# Patient Record
Sex: Male | Born: 1965 | Race: White | Hispanic: No | Marital: Married | Smoking: Current every day smoker
Health system: Southern US, Community
[De-identification: ages and names within clinical notes are randomized; demographics above are authoritative.]

## PROBLEM LIST (undated history)

## (undated) DIAGNOSIS — M1990 Unspecified osteoarthritis, unspecified site: Secondary | ICD-10-CM

## (undated) DIAGNOSIS — F1010 Alcohol abuse, uncomplicated: Secondary | ICD-10-CM

## (undated) DIAGNOSIS — F17200 Nicotine dependence, unspecified, uncomplicated: Secondary | ICD-10-CM

## (undated) DIAGNOSIS — K219 Gastro-esophageal reflux disease without esophagitis: Secondary | ICD-10-CM

## (undated) HISTORY — DX: Gastro-esophageal reflux disease without esophagitis: K21.9

## (undated) HISTORY — DX: Nicotine dependence, unspecified, uncomplicated: F17.200

## (undated) HISTORY — DX: Alcohol abuse, uncomplicated: F10.10

## (undated) HISTORY — DX: Unspecified osteoarthritis, unspecified site: M19.90

## (undated) HISTORY — PX: NO PAST SURGERIES: SHX2092

---

## 2010-07-12 ENCOUNTER — Inpatient Hospital Stay (HOSPITAL_COMMUNITY)
Admission: EM | Admit: 2010-07-12 | Discharge: 2010-07-15 | Payer: Self-pay | Source: Home / Self Care | Attending: Internal Medicine | Admitting: Internal Medicine

## 2010-10-09 LAB — BASIC METABOLIC PANEL
Calcium: 8.4 mg/dL (ref 8.4–10.5)
Creatinine, Ser: 0.86 mg/dL (ref 0.4–1.5)
GFR calc non Af Amer: >60 mL/min (ref >60)
Sodium: 136 mEq/L (ref 135–145)

## 2010-10-09 LAB — CBC
HCT: 35.7 % — ABNORMAL LOW (ref 39.0–52.0)
Hemoglobin: 12.2 g/dL — ABNORMAL LOW (ref 13.0–17.0)
MCHC: 34.2 g/dL (ref 30.0–36.0)
WBC: 7.6 10*3/uL (ref 4.0–10.5)

## 2010-10-09 LAB — WOUND CULTURE

## 2010-10-10 LAB — CBC
HCT: 40.2 % (ref 39.0–52.0)
Hemoglobin: 13.8 g/dL (ref 13.0–17.0)
MCH: 30.5 pg (ref 26.0–34.0)
RBC: 4.53 MIL/uL (ref 4.22–5.81)
RDW: 12 % (ref 11.5–15.5)
WBC: 13.6 10*3/uL — ABNORMAL HIGH (ref 4.0–10.5)

## 2010-10-10 LAB — BASIC METABOLIC PANEL
BUN: 6 mg/dL (ref 6–23)
Creatinine, Ser: 1.11 mg/dL (ref 0.4–1.5)
Glucose, Bld: 106 mg/dL — ABNORMAL HIGH (ref 70–99)
Potassium: 4.2 mEq/L (ref 3.5–5.1)

## 2010-10-10 LAB — CULTURE, BLOOD (ROUTINE X 2)
Culture  Setup Time: 201112150214
Culture  Setup Time: 201112150214
Culture: NO GROWTH

## 2010-10-10 LAB — DIFFERENTIAL
Basophils Absolute: 0 10*3/uL (ref 0.0–0.1)
Eosinophils Absolute: 0 10*3/uL (ref 0.0–0.7)
Eosinophils Relative: 0 % (ref 0–5)
Monocytes Relative: 8 % (ref 3–12)

## 2014-04-28 ENCOUNTER — Emergency Department (HOSPITAL_COMMUNITY): Payer: BC Managed Care – PPO

## 2014-04-28 ENCOUNTER — Emergency Department (HOSPITAL_COMMUNITY)
Admission: EM | Admit: 2014-04-28 | Discharge: 2014-04-28 | Disposition: A | Discharge status: Home / Self Care | Payer: BC Managed Care – PPO | Attending: Emergency Medicine | Admitting: Emergency Medicine

## 2014-04-28 ENCOUNTER — Encounter (HOSPITAL_COMMUNITY): Payer: Self-pay | Admitting: Emergency Medicine

## 2014-04-28 DIAGNOSIS — S0083XA Contusion of other part of head, initial encounter: Secondary | ICD-10-CM | POA: Insufficient documentation

## 2014-04-28 DIAGNOSIS — Y9301 Activity, walking, marching and hiking: Secondary | ICD-10-CM | POA: Diagnosis not present

## 2014-04-28 DIAGNOSIS — S0003XA Contusion of scalp, initial encounter: Secondary | ICD-10-CM | POA: Diagnosis not present

## 2014-04-28 DIAGNOSIS — S59919A Unspecified injury of unspecified forearm, initial encounter: Secondary | ICD-10-CM

## 2014-04-28 DIAGNOSIS — W19XXXA Unspecified fall, initial encounter: Secondary | ICD-10-CM

## 2014-04-28 DIAGNOSIS — W010XXA Fall on same level from slipping, tripping and stumbling without subsequent striking against object, initial encounter: Secondary | ICD-10-CM | POA: Insufficient documentation

## 2014-04-28 DIAGNOSIS — Y9289 Other specified places as the place of occurrence of the external cause: Secondary | ICD-10-CM | POA: Insufficient documentation

## 2014-04-28 DIAGNOSIS — S8990XA Unspecified injury of unspecified lower leg, initial encounter: Secondary | ICD-10-CM | POA: Insufficient documentation

## 2014-04-28 DIAGNOSIS — S9031XA Contusion of right foot, initial encounter: Secondary | ICD-10-CM

## 2014-04-28 DIAGNOSIS — S99919A Unspecified injury of unspecified ankle, initial encounter: Secondary | ICD-10-CM

## 2014-04-28 DIAGNOSIS — F17200 Nicotine dependence, unspecified, uncomplicated: Secondary | ICD-10-CM | POA: Insufficient documentation

## 2014-04-28 DIAGNOSIS — S9030XA Contusion of unspecified foot, initial encounter: Secondary | ICD-10-CM | POA: Insufficient documentation

## 2014-04-28 DIAGNOSIS — S1093XA Contusion of unspecified part of neck, initial encounter: Secondary | ICD-10-CM

## 2014-04-28 DIAGNOSIS — S99929A Unspecified injury of unspecified foot, initial encounter: Secondary | ICD-10-CM

## 2014-04-28 DIAGNOSIS — S6990XA Unspecified injury of unspecified wrist, hand and finger(s), initial encounter: Secondary | ICD-10-CM

## 2014-04-28 DIAGNOSIS — S59909A Unspecified injury of unspecified elbow, initial encounter: Secondary | ICD-10-CM | POA: Diagnosis not present

## 2014-04-28 DIAGNOSIS — M25522 Pain in left elbow: Secondary | ICD-10-CM

## 2014-04-28 MED ORDER — HYDROCODONE-ACETAMINOPHEN 5-325 MG PO TABS: 1.0000 | ORAL_TABLET | ORAL | Status: DC | PRN

## 2014-04-28 MED ORDER — HYDROCODONE-ACETAMINOPHEN 5-325 MG PO TABS
1.0000 | ORAL_TABLET | Freq: Once | ORAL | Status: AC
  Administered 2014-04-28: 1 via ORAL
  Filled 2014-04-28: qty 1

## 2014-04-28 MED ORDER — IBUPROFEN 800 MG PO TABS: 800.0000 mg | ORAL_TABLET | Freq: Three times a day (TID) | ORAL | Status: DC | PRN

## 2014-04-28 NOTE — ED Provider Notes (Signed)
CSN: [id number]     Arrival date & time 04/28/14  0602 History   First MD Initiated Contact with Patient 04/28/14 [phone]     Chief Complaint  Patient presents with  . Foot Injury     (Consider location/radiation/quality/duration/timing/severity/associated sxs/prior Treatment) HPI  Patient reports walking outside last night with his 5 dogs when his dogs "got under (his) feet" and tripped him.  He fell forward onto the ground, hitting his left elbow and right forehead.  Reports pain in his right dorsal foot and left elbow.  Denies LOC, headache, lightheadedness/dizziness, neck or back pain, CP, SOB, weakness or numbness of the extremities.  Reports tetanus vx is up to date.   History reviewed. No pertinent past medical history. History reviewed. No pertinent past surgical history. History reviewed. No pertinent family history. History  Substance Use Topics  . Smoking status: Current Every Day Smoker -- 1.00 packs/day    Types: Cigarettes  . Smokeless tobacco: Not on file  . Alcohol Use: Yes    Review of Systems  All other systems reviewed and are negative.     Allergies  Review of patient's allergies indicates no known allergies.  Home Medications   Prior to Admission medications   Medication Sig Start Date End Date Taking? Authorizing Provider  ibuprofen (ADVIL,MOTRIN) 800 MG tablet Take 800 mg by mouth every 8 (eight) hours as needed for mild pain.   Yes Historical Provider, MD   BP 142/103  Pulse 105  Temp(Src) 97.5 F (36.4 C) (Oral)  Resp 24  Ht 6' (1.829 m)  Wt 170 lb (77.111 kg)  BMI 23.05 kg/m2  SpO2 99% Physical Exam  Nursing note and vitals reviewed. Constitutional: He appears well-developed and well-nourished. No distress.  HENT:  Head: Normocephalic.    Neck: Neck supple.  Pulmonary/Chest: Effort normal.  Musculoskeletal:       Feet:  Right lower extremity:  Strength 5/5, sensation intact, distal pulses intact.   Calf nontender.  Compartments soft.   Tenderness over dorsal foot.  No crepitus.  Full AROM of all toes, slight decrease in ankle secondary to pain.    Left elbow with bony tenderness, mild edema over olecranon.  No break in skin.  Distal pulses and sensation intact, grip strength 5/5.   Neurological: He is alert. He has normal strength. No cranial nerve deficit or sensory deficit. He exhibits normal muscle tone. GCS eye subscore is 4. GCS verbal subscore is 5. GCS motor subscore is 6.  Skin: He is not diaphoretic.    ED Course  Procedures (including critical care time) Labs Review Labs Reviewed - No data to display  Imaging Review Dg Elbow Complete Left  04/28/2014   CLINICAL DATA:  Left elbow pain after fall.  EXAM: LEFT ELBOW - COMPLETE 3+ VIEW  COMPARISON:  None.  FINDINGS: There is no evidence of fracture, dislocation, or joint effusion. There is no evidence of arthropathy or other focal bone abnormality. Soft tissues are unremarkable.  IMPRESSION: Normal left elbow.   Electronically Signed   By: Roque LiasJames  Green M.D.   On: 04/28/2014 07:11   Dg Ankle Complete Right  04/28/2014   CLINICAL DATA:  Fall.  Foot pain.  EXAM: RIGHT ANKLE - COMPLETE 3+ VIEW  COMPARISON:  None.  FINDINGS: No evidence for an acute fracture. No subluxation or dislocation. Ankle mortise is preserved. Deformity of the base of the fifth metatarsal likely related to remote trauma.  IMPRESSION: No acute bony findings.   Electronically Signed  By: Kennith Center M.D.   On: 04/28/2014 07:15   Dg Foot Complete Right  04/28/2014   CLINICAL DATA:  Right foot pain after injury.  EXAM: RIGHT FOOT COMPLETE - 3+ VIEW  COMPARISON:  None.  FINDINGS: There is no evidence of fracture or dislocation. There is no evidence of arthropathy or other focal bone abnormality. Soft tissues are unremarkable.  IMPRESSION: Normal right foot.   Electronically Signed   By: Roque Lias M.D.   On: 04/28/2014 07:05     EKG Interpretation None       Filed Vitals:   04/28/14 0715    BP: 134/97  Pulse: 94  Temp:   Resp:      MDM   Final diagnoses:  Fall, initial encounter  Foot contusion, right, initial encounter  Left elbow pain    Afebrile, nontoxic patient with right foot and left elbow pain after mechanical fall last night.  Xrays negative.   D/C home with ibuprofen, norco, ace wrap.  Pt declined crutches.  PCP follow up PRN.  Discussed result, findings, treatment, and follow up  with patient.  Pt given return precautions.  Pt verbalizes understanding and agrees with plan.         Trixie Dredge, PA-C 04/28/14 1248  Medical screening examination/treatment/procedure(s) were performed by non-physician practitioner and as supervising physician I was immediately available for consultation/collaboration.   EKG Interpretation None        Tomasita Crumble, MD 04/28/14 1417

## 2014-04-28 NOTE — Discharge Instructions (Signed)
Read the information below.  Use the prescribed medication as directed.  Please discuss all new medications with your pharmacist.  Do not take additional tylenol while taking the prescribed pain medication to avoid overdose.  You may return to the Emergency Department at any time for worsening condition or any new symptoms that concern you.   If you develop uncontrolled pain, weakness or numbness of the extremity, severe discoloration of the skin, or you are unable to walk, return to the ER for a recheck.      Contusion A contusion is a deep bruise. Contusions happen when an injury causes bleeding under the skin. Signs of bruising include pain, puffiness (swelling), and discolored skin. The contusion may turn blue, purple, or yellow. HOME CARE   Put ice on the injured area.  Put ice in a plastic bag.  Place a towel between your skin and the bag.  Leave the ice on for 15-20 minutes, 03-04 times a day.  Only take medicine as told by your doctor.  Rest the injured area.  If possible, raise (elevate) the injured area to lessen puffiness. GET HELP RIGHT AWAY IF:   You have more bruising or puffiness.  You have pain that is getting worse.  Your puffiness or pain is not helped by medicine. MAKE SURE YOU:   Understand these instructions.  Will watch your condition.  Will get help right away if you are not doing well or get worse. Document Released: 01/02/2008 Document Revised: 10/08/2011 Document Reviewed: 05/21/2011 [facility] Patient Information 2015 [place], [place]. This information is not intended to replace advice given to you by your health care provider. Make sure you discuss any questions you have with your health care provider.

## 2014-04-28 NOTE — ED Notes (Signed)
Pt reports tripping over his dog yesterday evening and injuring his rt foot - contusion noted to dorsal side of foot - CMS intact - pt reports increased pain during ambulation. Pt also sustained head injury to rt forehead, small superficial lac/abrasion noted w/ small hematoma - pt denies LOC.

## 2014-04-28 NOTE — ED Notes (Signed)
Applied ace wrap to pt's right ankle

## 2015-01-03 ENCOUNTER — Ambulatory Visit (INDEPENDENT_AMBULATORY_CARE_PROVIDER_SITE_OTHER): Payer: BLUE CROSS/BLUE SHIELD

## 2015-01-03 ENCOUNTER — Ambulatory Visit (INDEPENDENT_AMBULATORY_CARE_PROVIDER_SITE_OTHER): Payer: BLUE CROSS/BLUE SHIELD | Admitting: Family Medicine

## 2015-01-03 VITALS — BP 124/78 | HR 66 | Temp 97.5°F | Resp 16 | Ht 72.0 in | Wt 168.6 lb

## 2015-01-03 DIAGNOSIS — M79671 Pain in right foot: Secondary | ICD-10-CM

## 2015-01-03 DIAGNOSIS — R9389 Abnormal findings on diagnostic imaging of other specified body structures: Secondary | ICD-10-CM

## 2015-01-03 DIAGNOSIS — R938 Abnormal findings on diagnostic imaging of other specified body structures: Secondary | ICD-10-CM

## 2015-01-03 MED ORDER — HYDROCODONE-ACETAMINOPHEN 5-325 MG PO TABS: 1.0000 | ORAL_TABLET | ORAL | Status: DC | PRN

## 2015-01-03 NOTE — Patient Instructions (Signed)
Wear the walking boot for now, lessen amount of stairs and repetitive walking over next 2 weeks. If still having pain in the boot, recommend getting the weight off that foot by using crutches - can return here if those are needed. Tylenol if needed. Recheck in next 10-14 days. Sooner if worse.

## 2015-01-03 NOTE — Progress Notes (Addendum)
Subjective:  This chart was scribed for Kirk Staggers, MD by [facility], medical scribe at [facility].The patient was seen in exam room 01 and the patient's care was started at 8:42 AM.   Patient ID: Kirk Bradley, male    DOB: [birth year], 49 y.o.   MRN: [id number] Chief Complaint  Patient presents with  . Foot Pain    hurt right foot last year by twisting it; states the pain has return   HPI HPI Comments: Kirk Bradley is a 49 y.o. male who presents to [facility] complaining of right foot pain. Original injury was in September 2015, X-ray September 2015. He rolled his ankle and fell of his back deck. Feeling is like someone has stabbed him in the foot. Yesterday afternoon he re-injured his foot after twisting his ankle,while working out in his yard. Pain mostly while bearing weight, no treatment. Symptoms have not improved today. He was walking more so since November, he walks roughly 10 miles, over the last couple weeks he has been walking up more stairs throughout the day. He works in [employer]. He denies swelling, ecchymosis, numbness and weakness.   There are no active problems to display for this patient.  History reviewed. No pertinent past medical history. History reviewed. No pertinent past surgical history. No Known Allergies Prior to Admission medications   Not on File   History   Social History  . Marital Status: Married    Spouse Name: N/A  . Number of Children: N/A  . Years of Education: N/A   Occupational History  . Not on file.   Social History Main Topics  . Smoking status: Current Every Day Smoker -- 1.00 packs/day    Types: Cigarettes  . Smokeless tobacco: Not on file  . Alcohol Use: Yes  . Drug Use: No  . Sexual Activity: Not on file   Other Topics Concern  . Not on file   Social History Narrative   Review of Systems  Musculoskeletal: Positive for arthralgias and gait problem.  Neurological:  Negative for weakness and numbness.      Objective:  BP 124/78 mmHg  Pulse 66  Temp(Src) 97.5 F (36.4 C) (Oral)  Resp 16  Ht 6' (1.829 m)  Wt 168 lb 9.6 oz (76.476 kg)  BMI 22.86 kg/m2  SpO2 98% Physical Exam  Constitutional: He is oriented to person, place, and time. He appears well-developed and well-nourished. No distress.  HENT:  Head: Normocephalic and atraumatic.  Eyes: Pupils are equal, round, and reactive to light.  Neck: Normal range of motion.  Cardiovascular: Normal rate and regular rhythm.   Pulmonary/Chest: Effort normal. No respiratory distress.  Musculoskeletal: Normal range of motion.  Right ankle: No malleolar tenderness, no proximal fibula tenderness. Full ROM of the right ankle, negative drawer, negative Taylor Tilt, negative Kliger. Navicula is non-tender. Most tender over the proximal 5th metatarsal Peroneal insertion, slight tenderness over the base of the 4th metatarsal.  DP pulse was 2+, NVI distally, cap refill less than one second.  Neurological: He is alert and oriented to person, place, and time.  Skin: Skin is warm and dry.  Psychiatric: He has a normal mood and affect. His behavior is normal.  Nursing note and vitals reviewed.  [facility] reading (PRIMARY) by  Dr. Neva Seat: R foot: cystic appearing changes 5th metatarsal styloid - questionable nondisplaced fracture on 1 view.      Assessment & Plan:   Kirk Bradley is a  49 y.o. male Foot pain, right - Plan: DG Foot Complete Right, HYDROcodone-acetaminophen (NORCO/VICODIN) 5-325 MG per tablet  Abnormal x-ray - Plan: HYDROcodone-acetaminophen (NORCO/VICODIN) 5-325 MG per tablet Questionable fracture and cystic change of proximal 5th MT. Appears to be in styloid area, not metaphyseal-diaphyseal junction.  ddx of stress injury vs prior fx.  -short cam walker, decrease repetitive activity and if still pain with this - NWB with crutches if needed. If pain has improve and not painful to wear steel toed boot  - can use this.   -tylenol otc prn. Ice, elevate next few days.   -lortab if needed for more severe pain - SED, and not to take at work.   -recehck in 10-14 days. Sooner if worse.    Meds ordered this encounter  Medications  . HYDROcodone-acetaminophen (NORCO/VICODIN) 5-325 MG per tablet    Sig: Take 1 tablet by mouth every 4 (four) hours as needed for moderate pain or severe pain.    Dispense:  10 tablet    Refill:  0   Patient Instructions  Wear the walking boot for now, lessen amount of stairs and repetitive walking over next 2 weeks. If still having pain in the boot, recommend getting the weight off that foot by using crutches - can return here if those are needed. Tylenol if needed. Recheck in next 10-14 days. Sooner if worse.

## 2015-01-04 ENCOUNTER — Ambulatory Visit: Payer: Self-pay | Admitting: Physician Assistant

## 2015-01-21 ENCOUNTER — Ambulatory Visit (INDEPENDENT_AMBULATORY_CARE_PROVIDER_SITE_OTHER): Payer: BLUE CROSS/BLUE SHIELD | Admitting: Urgent Care

## 2015-01-21 VITALS — BP 108/76 | HR 76 | Temp 97.9°F | Resp 16 | Ht 71.0 in | Wt 167.0 lb

## 2015-01-21 DIAGNOSIS — M25571 Pain in right ankle and joints of right foot: Secondary | ICD-10-CM | POA: Diagnosis not present

## 2015-01-21 MED ORDER — GABAPENTIN 300 MG PO CAPS: 300.0000 mg | ORAL_CAPSULE | Freq: Three times a day (TID) | ORAL | Status: DC

## 2015-01-21 MED ORDER — MELOXICAM 15 MG PO TABS: 15.0000 mg | ORAL_TABLET | Freq: Every day | ORAL | Status: DC

## 2015-01-21 NOTE — Patient Instructions (Addendum)
Day 1 take 1 Gabapentin pill Day 2 take 2 Gabapentin pills Day 3+ take 3 Gabapentin pills daily  RICE: Routine Care for Injuries The routine care of many injuries includes Rest, Ice, Compression, and Elevation (RICE). HOME CARE INSTRUCTIONS  Rest is needed to allow your body to heal. Routine activities can usually be resumed when comfortable. Injured tendons and bones can take up to 6 weeks to heal. Tendons are the cord-like structures that attach muscle to bone.  Ice following an injury helps keep the swelling down and reduces pain.  Put ice in a plastic bag.  Place a towel between your skin and the bag.  Leave the ice on for 15-20 minutes, 3-4 times a day, or as directed by your health care provider. Do this while awake, for the first 24 to 48 hours. After that, continue as directed by your caregiver.  Compression helps keep swelling down. It also gives support and helps with discomfort. If an elastic bandage has been applied, it should be removed and reapplied every 3 to 4 hours. It should not be applied tightly, but firmly enough to keep swelling down. Watch fingers or toes for swelling, bluish discoloration, coldness, numbness, or excessive pain. If any of these problems occur, remove the bandage and reapply loosely. Contact your caregiver if these problems continue.  Elevation helps reduce swelling and decreases pain. With extremities, such as the arms, hands, legs, and feet, the injured area should be placed near or above the level of the heart, if possible. SEEK IMMEDIATE MEDICAL CARE IF:  You have persistent pain and swelling.  You develop redness, numbness, or unexpected weakness.  Your symptoms are getting worse rather than improving after several days. These symptoms may indicate that further evaluation or further X-rays are needed. Sometimes, X-rays may not show a small broken bone (fracture) until 1 week or 10 days later. Make a follow-up appointment with your caregiver. Ask  when your X-ray results will be ready. Make sure you get your X-ray results. Document Released: 10/28/2000 Document Revised: 07/21/2013 Document Reviewed: 12/15/2010 [facility] Patient Information 2015 [place], [place]. This information is not intended to replace advice given to you by your health care provider. Make sure you discuss any questions you have with your health care provider.

## 2015-01-21 NOTE — Progress Notes (Signed)
    MRN: [id number] DOB: 01-26-1966  Subjective:   Kirk Bradley is a 49 y.o. male presenting for follow up on right ankle injury. Patient was seen here at [facility] by Dr. Neva Seat on 01/03/2015. He was advised to wear cam walker boot for 10-14 days due to possible non-displaced fracture at base of 5th metatarsal. Today, reports that he rested from work for one week and thereafter when he returned felt the need to make up for lost time and start working 10 hour days with very little rest in between. Also admits that he has not worn cam walker consistently and is not allowed to do so at work. He has been taking ibuprofen 800 mg up to 3 times a day which has helped only some of the time. Today, he reports that he has had increasing pain over the past several days diffusely over his ankle lateral foot. His pain is burning type sensation sometimes very focal and stinging around the base of the fifth. He denies erythema, swelling, hearing popping noises. He denies fever or decreased range of motion, no changes in sensation. Denies any other aggravating or relieving factors, no other questions or concerns.  Kirk Bradley has a current medication list which includes the following prescription(s): hydrocodone-acetaminophen. He has No Known Allergies.  Kirk Bradley  has no past medical history on file. Also  has no past surgical history on file.  ROS As in subjective.  Objective:   Vitals: BP 108/76 mmHg  Pulse 76  Temp(Src) 97.9 F (36.6 C)  Resp 16  Ht 5\' 11"  (1.803 m)  Wt 167 lb (75.751 kg)  BMI 23.30 kg/m2  SpO2 98%  Physical Exam  Constitutional: He is oriented to person, place, and time. He appears well-developed and well-nourished.  Cardiovascular: Normal rate.   Pulmonary/Chest: Effort normal.  Musculoskeletal:       Right ankle: He exhibits normal range of motion, no swelling, no ecchymosis, no deformity and no laceration. Tenderness. Lateral malleolus, medial malleolus and  head of 5th metatarsal tenderness found. No AITFL, no CF ligament, no posterior TFL and no proximal fibula tenderness found.  Neurological: He is alert and oriented to person, place, and time. Coordination normal.  Skin: Skin is warm and dry. No rash noted. No erythema. No pallor.   Assessment and Plan :   1. Right ankle pain - Discussed case with Dr. Neva Seat who attended the patient initially. We agreed that best treatment plan was to switch from ibuprofen to meloxicam, we'll also start gabapentin for his neuropathic-type pain. Patient requested opioid medication which I declined. I recommended he wear the cam walker more consistently and unfortunately since resting from work is not an option for patient, we decided the best course of action would be to refer patient to orthopedics so they can manage more aggressively so he can get back to work. Patient agreed, followup as needed.  Wallis Bamberg, PA-C [facility] [phone] 01/21/2015 9:58 AM

## 2015-02-09 ENCOUNTER — Encounter: Payer: Self-pay | Admitting: Urgent Care

## 2015-02-09 DIAGNOSIS — M79671 Pain in right foot: Secondary | ICD-10-CM | POA: Insufficient documentation

## 2015-02-17 ENCOUNTER — Other Ambulatory Visit: Payer: Self-pay | Admitting: Urgent Care

## 2015-02-18 NOTE — Telephone Encounter (Signed)
That's fine I will refill this once more for patient but he is to follow up with orthopedics from here on out. Please let him know. Thank you!

## 2015-02-18 NOTE — Telephone Encounter (Signed)
It looks like pt is under orthopedics care. Did u still want to refill?

## 2015-03-18 ENCOUNTER — Other Ambulatory Visit: Payer: Self-pay | Admitting: Urgent Care

## 2015-04-21 ENCOUNTER — Encounter (INDEPENDENT_AMBULATORY_CARE_PROVIDER_SITE_OTHER): Payer: Self-pay

## 2015-04-21 ENCOUNTER — Encounter: Payer: Self-pay | Admitting: Family Medicine

## 2015-04-21 ENCOUNTER — Ambulatory Visit (INDEPENDENT_AMBULATORY_CARE_PROVIDER_SITE_OTHER): Payer: BLUE CROSS/BLUE SHIELD | Admitting: Family Medicine

## 2015-04-21 VITALS — BP 110/80 | HR 88 | Temp 97.7°F | Ht 71.0 in | Wt 163.5 lb

## 2015-04-21 DIAGNOSIS — M1990 Unspecified osteoarthritis, unspecified site: Secondary | ICD-10-CM | POA: Insufficient documentation

## 2015-04-21 DIAGNOSIS — F1090 Alcohol use, unspecified, uncomplicated: Secondary | ICD-10-CM | POA: Insufficient documentation

## 2015-04-21 DIAGNOSIS — K219 Gastro-esophageal reflux disease without esophagitis: Secondary | ICD-10-CM

## 2015-04-21 DIAGNOSIS — Z13220 Encounter for screening for lipoid disorders: Secondary | ICD-10-CM

## 2015-04-21 DIAGNOSIS — M159 Polyosteoarthritis, unspecified: Secondary | ICD-10-CM

## 2015-04-21 DIAGNOSIS — Z7289 Other problems related to lifestyle: Secondary | ICD-10-CM | POA: Insufficient documentation

## 2015-04-21 DIAGNOSIS — M150 Primary generalized (osteo)arthritis: Secondary | ICD-10-CM

## 2015-04-21 DIAGNOSIS — R109 Unspecified abdominal pain: Secondary | ICD-10-CM | POA: Diagnosis not present

## 2015-04-21 DIAGNOSIS — Z0000 Encounter for general adult medical examination without abnormal findings: Secondary | ICD-10-CM | POA: Diagnosis not present

## 2015-04-21 DIAGNOSIS — F1010 Alcohol abuse, uncomplicated: Secondary | ICD-10-CM

## 2015-04-21 DIAGNOSIS — F17200 Nicotine dependence, unspecified, uncomplicated: Secondary | ICD-10-CM | POA: Insufficient documentation

## 2015-04-21 DIAGNOSIS — Z0001 Encounter for general adult medical examination with abnormal findings: Secondary | ICD-10-CM | POA: Insufficient documentation

## 2015-04-21 DIAGNOSIS — Z720 Tobacco use: Secondary | ICD-10-CM | POA: Insufficient documentation

## 2015-04-21 LAB — POCT URINALYSIS DIPSTICK
Bilirubin, UA: NEGATIVE
GLUCOSE UA: NEGATIVE
Ketones, UA: NEGATIVE
Leukocytes, UA: NEGATIVE
NITRITE UA: NEGATIVE
PH UA: 6
PROTEIN UA: NEGATIVE
RBC UA: NEGATIVE
Spec Grav, UA: >=1.03
UROBILINOGEN UA: 0.2

## 2015-04-21 NOTE — Progress Notes (Signed)
Pre visit review using our clinic review tool, if applicable. No additional management support is needed unless otherwise documented below in the visit note. 

## 2015-04-21 NOTE — Assessment & Plan Note (Signed)
H/o legal trouble (DUI) but not recently. Drinks at night at home, does not leave house after drinking. Discussed health concerns with this (GI, liver, etc). Check labwork today. Encouraged continued attempts at slow titration off alcohol for his longterm health. Pt will not return to AA, declines referral to substance abuse counselor today.

## 2015-04-21 NOTE — Assessment & Plan Note (Signed)
New - in smoker check UA to r/o hematuria.

## 2015-04-21 NOTE — Assessment & Plan Note (Signed)
Precontemplative. Continue to encourage cessation 

## 2015-04-21 NOTE — Assessment & Plan Note (Signed)
Discussed NSAID use and alcohol use and how it relates to GERD.

## 2015-04-21 NOTE — Addendum Note (Signed)
Addended by: Josph Macho A on: 04/21/2015 05:03 PM   Modules accepted: Orders

## 2015-04-21 NOTE — Progress Notes (Signed)
BP 110/80 mmHg  Pulse 88  Temp(Src) 97.7 F (36.5 C) (Oral)  Ht  (1.803 m)  Wt 163 lb 8 oz (74.163 kg)  BMI 22.81 kg/m2   CC: new pt to establish care, would like CPE  Subjective:    Patient ID: Kirk Bradley, male    DOB: [birth year], 49 y.o.   MRN: [id number]  HPI: Kirk Bradley is a 49 y.o. male presenting on 04/21/2015 for Establish Care   Presents with wife today.   Chronic pain from osteoarthritis - joints, muscles. 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 Takes gabapentin  TID, ibuprofen  BID. Tylenol causes GI upset.   Alcohol abuse - 12 pack/day. Has had DUI 2013, completed required class, stayed in touch with supportive classmates. No DT, no withdrawals. Slowly cutting down, now drinking every other day. To transition to light beer.  Smoking 1-2 ppd. Started age 41yo. precontemplative  Preventative: Flu shot - declines Tetanus - thinks 06/2010 when he had spider bite Seat belt use discussed Sunscreen use discussed. No changing moles on skin.  Lives with wife and 8 dogs Occupation: works in [employer] representative Edu: GED Activity: very active at work Diet: good water, fruits/vegetables daily  Relevant past medical, surgical, family and social history reviewed and updated as indicated. Interim medical history since our last visit reviewed. Allergies and medications reviewed and updated. Current Outpatient Prescriptions on File Prior to Visit  Medication Sig  . gabapentin (NEURONTIN) 300 MG capsule Take 1 capsule (300 mg total) by mouth 3 (three) times daily.   No current facility-administered medications on file prior to visit.    Review of Systems  Constitutional: Negative for fever, chills, activity change, appetite change, fatigue and unexpected weight change.  HENT: Negative for hearing loss.   Eyes: Negative for visual disturbance.  Respiratory: Negative for cough, chest tightness, shortness of breath and wheezing.   Cardiovascular: Negative  for chest pain, palpitations and leg swelling.  Gastrointestinal: Negative for nausea, vomiting, abdominal pain, diarrhea, constipation, blood in stool and abdominal distention.  Genitourinary: Negative for hematuria and difficulty urinating.  Musculoskeletal: Negative for myalgias, arthralgias and neck pain.  Skin: Negative for rash.  Neurological: Negative for dizziness, seizures, syncope and headaches.  Hematological: Negative for adenopathy. Does not bruise/bleed easily.  Psychiatric/Behavioral: Negative for dysphoric mood. The patient is not nervous/anxious.    Per HPI unless specifically indicated above     Objective:    BP 110/80 mmHg  Pulse 88  Temp(Src) 97.7 F (36.5 C) (Oral)  Ht  (1.803 m)  Wt 163 lb 8 oz (74.163 kg)  BMI 22.81 kg/m2  Wt Readings from Last 3 Encounters:  04/21/15 163 lb 8 oz (74.163 kg)  01/21/15 167 lb (75.751 kg)  01/03/15 168 lb 9.6 oz (76.476 kg)    Physical Exam  Constitutional: He is oriented to person, place, and time. He appears well-developed and well-nourished. No distress.  HENT:  Head: Normocephalic and atraumatic.  Right Ear: Hearing, tympanic membrane, external ear and ear canal normal.  Left Ear: Hearing, tympanic membrane, external ear and ear canal normal.  Nose: Nose normal.  Mouth/Throat: Uvula is midline, oropharynx is clear and moist and mucous membranes are normal. No oropharyngeal exudate, posterior oropharyngeal edema or posterior oropharyngeal erythema.  Eyes: Conjunctivae and EOM are normal. Pupils are equal, round, and reactive to light. No scleral icterus.  Neck: Normal range of motion. Neck supple. No thyromegaly present.  Cardiovascular: Normal rate, regular rhythm, normal heart sounds and intact distal pulses.  No murmur heard. Pulses:      Radial pulses are 2+ on the right side, and 2+ on the left side.  Pulmonary/Chest: Effort normal and breath sounds normal. No respiratory distress. He has no wheezes. He has  no rales.  Abdominal: Soft. Normal appearance and bowel sounds are normal. He exhibits no distension and no mass. There is no hepatosplenomegaly. There is no tenderness. There is no rigidity, no rebound, no guarding, no CVA tenderness and negative Murphy's sign.  Musculoskeletal: Normal range of motion. He exhibits no edema.  Lymphadenopathy:    He has no cervical adenopathy.  Neurological: He is alert and oriented to person, place, and time.  CN grossly intact, station and gait intact  Skin: Skin is warm and dry. No rash noted.  Psychiatric: He has a normal mood and affect. His behavior is normal. Judgment and thought content normal.  Nursing note and vitals reviewed.      Assessment & Plan:   Problem List Items Addressed This Visit    Smoker    Precontemplative. Continue to encourage cessation.      Right flank pain    New - in smoker check UA to r/o hematuria.       Osteoarthritis    Continue ibuprofen, gabapentin. Discussed risks of NSAIDs and GI toxicity esp in setting of alcohol abuse.      Relevant Medications   ibuprofen (ADVIL,MOTRIN) 800 MG tablet   Health maintenance examination - Primary    Preventative protocols reviewed and updated unless pt declined. Discussed healthy diet and lifestyle.       GERD (gastroesophageal reflux disease)    Discussed NSAID use and alcohol use and how it relates to GERD.      Alcohol use disorder, mild, abuse    H/o legal trouble (DUI) but not recently. Drinks at night at home, does not leave house after drinking. Discussed health concerns with this (GI, liver, etc). Check labwork today. Encouraged continued attempts at slow titration off alcohol for his longterm health. Pt will not return to AA, declines referral to substance abuse counselor today.       Relevant Orders   Comprehensive metabolic panel   TSH   CBC with Differential/Platelet   Vitamin B12   Folate   Vitamin B1   Protime-INR    Other Visit Diagnoses    Lipid  screening        Relevant Orders    Lipid panel        Follow up plan: Return in about 1 year (around 04/20/2016), or as needed, for annual exam, prior fasting for blood work.

## 2015-04-21 NOTE — Assessment & Plan Note (Signed)
Preventative protocols reviewed and updated unless pt declined. Discussed healthy diet and lifestyle.  

## 2015-04-21 NOTE — Assessment & Plan Note (Addendum)
Continue ibuprofen, gabapentin. Discussed risks of NSAIDs and GI toxicity esp in setting of alcohol abuse.

## 2015-04-21 NOTE — Patient Instructions (Signed)
Urinalysis today. labwork today. Return as needed or in 1 year for next physical. Work towards quitting alcohol and cigarettes. If you cut down on alcohol you need to do this gradually - don't quit alcohol cold [place].  Health Maintenance A healthy lifestyle and preventative care can promote health and wellness.  Maintain regular health, dental, and eye exams.  Eat a healthy diet. Foods like vegetables, fruits, whole grains, low-fat dairy products, and lean protein foods contain the nutrients you need and are low in calories. Decrease your intake of foods high in solid fats, added sugars, and salt. Get information about a proper diet from your health care Jalik Gellatly, if necessary.  Regular physical exercise is one of the most important things you can do for your health. Most adults should get at least 150 minutes of moderate-intensity exercise (any activity that increases your heart rate and causes you to sweat) each week. In addition, most adults need muscle-strengthening exercises on 2 or more days a week.   Maintain a healthy weight. The body mass index (BMI) is a screening tool to identify possible weight problems. It provides an estimate of body fat based on height and weight. Your health care Buel Molder can find your BMI and can help you achieve or maintain a healthy weight. For males 20 years and older:  A BMI below 18.5 is considered underweight.  A BMI of 18.5 to 24.9 is normal.  A BMI of 25 to 29.9 is considered overweight.  A BMI of 30 and above is considered obese.  Maintain normal blood lipids and cholesterol by exercising and minimizing your intake of saturated fat. Eat a balanced diet with plenty of fruits and vegetables. Blood tests for lipids and cholesterol should begin at age 47 and be repeated every 5 years. If your lipid or cholesterol levels are high, you are over age 58, or you are at high risk for heart disease, you may need your cholesterol levels checked more  frequently.Ongoing high lipid and cholesterol levels should be treated with medicines if diet and exercise are not working.  If you smoke, find out from your health care Jaimes Eckert how to quit. If you do not use tobacco, do not start.  Lung cancer screening is recommended for adults aged 55-80 years who are at high risk for developing lung cancer because of a history of smoking. A yearly low-dose CT scan of the lungs is recommended for people who have at least a 30-pack-year history of smoking and are current smokers or have quit within the past 15 years. A pack year of smoking is smoking an average of 1 pack of cigarettes a day for 1 year (for example, a 30-pack-year history of smoking could mean smoking 1 pack a day for 30 years or 2 packs a day for 15 years). Yearly screening should continue until the smoker has stopped smoking for at least 15 years. Yearly screening should be stopped for people who develop a health problem that would prevent them from having lung cancer treatment.  If you choose to drink alcohol, do not have more than 2 drinks per day. One drink is considered to be 12 oz (360 mL) of beer, 5 oz (150 mL) of wine, or 1.5 oz (45 mL) of liquor.  Avoid the use of street drugs. Do not share needles with anyone. Ask for help if you need support or instructions about stopping the use of drugs.  High blood pressure causes heart disease and increases the risk of stroke. Blood  pressure should be checked at least every 1-2 years. Ongoing high blood pressure should be treated with medicines if weight loss and exercise are not effective.  If you are 28-84 years old, ask your health care Kysen Wetherington if you should take aspirin to prevent heart disease.  Diabetes screening involves taking a blood sample to check your fasting blood sugar level. This should be done once every 3 years after age 37 if you are at a normal weight and without risk factors for diabetes. Testing should be considered at a younger  age or be carried out more frequently if you are overweight and have at least 1 risk factor for diabetes.  Colorectal cancer can be detected and often prevented. Most routine colorectal cancer screening begins at the age of 39 and continues through age 27. However, your health care Analicia Skibinski may recommend screening at an earlier age if you have risk factors for colon cancer. On a yearly basis, your health care Selah Klang may provide home test kits to check for hidden blood in the stool. A small camera at the end of a tube may be used to directly examine the colon (sigmoidoscopy or colonoscopy) to detect the earliest forms of colorectal cancer. Talk to your health care Ishmael Berkovich about this at age 33 when routine screening begins. A direct exam of the colon should be repeated every 5-10 years through age 69, unless early forms of precancerous polyps or small growths are found.  People who are at an increased risk for hepatitis B should be screened for this virus. You are considered at high risk for hepatitis B if:  You were born in a country where hepatitis B occurs often. Talk with your health care Zakery Normington about which countries are considered high risk.  Your parents were born in a high-risk country and you have not received a shot to protect against hepatitis B (hepatitis B vaccine).  You have HIV or AIDS.  You use needles to inject street drugs.  You live with, or have sex with, someone who has hepatitis B.  You are a man who has sex with other men (MSM).  You get hemodialysis treatment.  You take certain medicines for conditions like cancer, organ transplantation, and autoimmune conditions.  Hepatitis C blood testing is recommended for all people born from [birth year] through 1963 and any individual with known risk factors for hepatitis C.  Healthy men should no longer receive prostate-specific antigen (PSA) blood tests as part of routine cancer screening. Talk to your health care Kaaren Nass about  prostate cancer screening.  Testicular cancer screening is not recommended for adolescents or adult males who have no symptoms. Screening includes self-exam, a health care Abigial Newville exam, and other screening tests. Consult with your health care Tomorrow Dehaas about any symptoms you have or any concerns you have about testicular cancer.  Practice safe sex. Use condoms and avoid high-risk sexual practices to reduce the spread of sexually transmitted infections (STIs).  You should be screened for STIs, including gonorrhea and chlamydia if:  You are sexually active and are younger than 24 years.  You are older than 24 years, and your health care Tiaria Biby tells you that you are at risk for this type of infection.  Your sexual activity has changed since you were last screened, and you are at an increased risk for chlamydia or gonorrhea. Ask your health care Shaquan Missey if you are at risk.  If you are at risk of being infected with HIV, it is recommended that you take  a prescription medicine daily to prevent HIV infection. This is called pre-exposure prophylaxis (PrEP). You are considered at risk if:  You are a man who has sex with other men (MSM).  You are a heterosexual man who is sexually active with multiple partners.  You take drugs by injection.  You are sexually active with a partner who has HIV.  Talk with your health care Toluwani Yadav about whether you are at high risk of being infected with HIV. If you choose to begin PrEP, you should first be tested for HIV. You should then be tested every 3 months for as long as you are taking PrEP.  Use sunscreen. Apply sunscreen liberally and repeatedly throughout the day. You should seek shade when your shadow is shorter than you. Protect yourself by wearing long sleeves, pants, a wide-brimmed hat, and sunglasses year round whenever you are outdoors.  Tell your health care Annabell Oconnor of new moles or changes in moles, especially if there is a change in shape or  color. Also, tell your health care Casaundra Takacs if a mole is larger than the size of a pencil eraser.  A one-time screening for abdominal aortic aneurysm (AAA) and surgical repair of large AAAs by ultrasound is recommended for men aged 35-75 years who are current or former smokers.  Stay current with your vaccines (immunizations). Document Released: 01/12/2008 Document Revised: 07/21/2013 Document Reviewed: 12/11/2010 [facility] Patient Information 2015 [place], [place]. This information is not intended to replace advice given to you by your health care Jeter Tomey. Make sure you discuss any questions you have with your health care Koron Godeaux.

## 2015-04-22 ENCOUNTER — Telehealth: Payer: Self-pay | Admitting: Radiology

## 2015-04-22 LAB — LIPID PANEL
CHOLESTEROL: 180 mg/dL (ref 0–200)
HDL: 64.5 mg/dL (ref >39.00)
LDL Cholesterol: 95 mg/dL (ref 0–99)
NonHDL: 115.18
TRIGLYCERIDES: 102 mg/dL (ref 0.0–149.0)
Total CHOL/HDL Ratio: 3
VLDL: 20.4 mg/dL (ref 0.0–40.0)

## 2015-04-22 LAB — CBC WITH DIFFERENTIAL/PLATELET
BASOS ABS: 0.1 10*3/uL (ref 0.0–0.1)
BASOS PCT: 0.8 % (ref 0.0–3.0)
EOS ABS: 0.3 10*3/uL (ref 0.0–0.7)
Eosinophils Relative: 3.3 % (ref 0.0–5.0)
HCT: 43.7 % (ref 39.0–52.0)
HEMOGLOBIN: 14.4 g/dL (ref 13.0–17.0)
Lymphocytes Relative: 35.2 % (ref 12.0–46.0)
Lymphs Abs: 2.8 10*3/uL (ref 0.7–4.0)
MCHC: 32.9 g/dL (ref 30.0–36.0)
MCV: 98.4 fl (ref 78.0–100.0)
MONO ABS: 0.7 10*3/uL (ref 0.1–1.0)
Monocytes Relative: 8.2 % (ref 3.0–12.0)
Neutro Abs: 4.2 10*3/uL (ref 1.4–7.7)
Neutrophils Relative %: 52.5 % (ref 43.0–77.0)
Platelets: 214 10*3/uL (ref 150.0–400.0)
RBC: 4.44 Mil/uL (ref 4.22–5.81)
RDW: 13.3 % (ref 11.5–15.5)
WBC: 8 10*3/uL (ref 4.0–10.5)

## 2015-04-22 LAB — COMPREHENSIVE METABOLIC PANEL
ALBUMIN: 4.5 g/dL (ref 3.5–5.2)
ALK PHOS: 46 U/L (ref 39–117)
ALT: 24 U/L (ref 0–53)
AST: 29 U/L (ref 0–37)
BILIRUBIN TOTAL: 0.4 mg/dL (ref 0.2–1.2)
BUN: 19 mg/dL (ref 6–23)
CALCIUM: 9.5 mg/dL (ref 8.4–10.5)
CO2: 27 meq/L (ref 19–32)
CREATININE: 0.97 mg/dL (ref 0.40–1.50)
Chloride: 102 mEq/L (ref 96–112)
GFR: 88.07 mL/min (ref >60.00)
Glucose, Bld: 78 mg/dL (ref 70–99)
Potassium: 4.3 mEq/L (ref 3.5–5.1)
Sodium: 136 mEq/L (ref 135–145)
TOTAL PROTEIN: 7.3 g/dL (ref 6.0–8.3)

## 2015-04-22 LAB — TSH: TSH: 0.63 u[IU]/mL (ref 0.35–4.50)

## 2015-04-22 LAB — FOLATE: FOLATE: 19.3 ng/mL (ref >5.9)

## 2015-04-22 LAB — PROTIME-INR
INR: 1.2 ratio — ABNORMAL HIGH (ref 0.8–1.0)
PROTHROMBIN TIME: 12.8 s (ref 9.6–13.1)

## 2015-04-22 LAB — VITAMIN B12: VITAMIN B 12: 977 pg/mL — AB (ref 211–911)

## 2015-04-22 LAB — VITAMIN B1

## 2015-04-22 NOTE — Telephone Encounter (Signed)
[facility] lab called saying the sample for Vit B1  wasn't frozen when they received it. Needs to be redrawn. Pt notified, will come Monday afternoon for a redraw.

## 2015-04-25 ENCOUNTER — Other Ambulatory Visit (INDEPENDENT_AMBULATORY_CARE_PROVIDER_SITE_OTHER): Payer: BLUE CROSS/BLUE SHIELD

## 2015-04-25 DIAGNOSIS — K219 Gastro-esophageal reflux disease without esophagitis: Secondary | ICD-10-CM

## 2015-04-26 ENCOUNTER — Encounter: Payer: Self-pay | Admitting: *Deleted

## 2015-04-29 ENCOUNTER — Encounter: Payer: Self-pay | Admitting: Family Medicine

## 2015-04-29 DIAGNOSIS — M25571 Pain in right ankle and joints of right foot: Secondary | ICD-10-CM

## 2015-04-29 LAB — VITAMIN B1: Vitamin B1 (Thiamine): 7 nmol/L — ABNORMAL LOW (ref 8–30)

## 2015-04-29 MED ORDER — IBUPROFEN 800 MG PO TABS: 800.0000 mg | ORAL_TABLET | Freq: Two times a day (BID) | ORAL | Status: DC

## 2015-04-29 MED ORDER — GABAPENTIN 300 MG PO CAPS: 300.0000 mg | ORAL_CAPSULE | Freq: Three times a day (TID) | ORAL | Status: DC

## 2015-04-30 ENCOUNTER — Other Ambulatory Visit: Payer: Self-pay | Admitting: Family Medicine

## 2015-04-30 MED ORDER — VITAMIN B-1 100 MG PO TABS: 100.0000 mg | ORAL_TABLET | Freq: Every day | ORAL | Status: DC

## 2015-05-11 ENCOUNTER — Other Ambulatory Visit: Payer: Self-pay | Admitting: Urgent Care

## 2016-09-21 ENCOUNTER — Ambulatory Visit (INDEPENDENT_AMBULATORY_CARE_PROVIDER_SITE_OTHER): Payer: BLUE CROSS/BLUE SHIELD | Admitting: Family Medicine

## 2016-09-21 ENCOUNTER — Ambulatory Visit (INDEPENDENT_AMBULATORY_CARE_PROVIDER_SITE_OTHER)
Admission: RE | Admit: 2016-09-21 | Discharge: 2016-09-21 | Disposition: A | Discharge status: Home / Self Care | Payer: BLUE CROSS/BLUE SHIELD | Source: Ambulatory Visit | Attending: Family Medicine | Admitting: Family Medicine

## 2016-09-21 ENCOUNTER — Encounter: Payer: Self-pay | Admitting: Family Medicine

## 2016-09-21 VITALS — BP 110/76 | HR 76 | Temp 97.7°F | Wt 169.5 lb

## 2016-09-21 DIAGNOSIS — M150 Primary generalized (osteo)arthritis: Secondary | ICD-10-CM

## 2016-09-21 DIAGNOSIS — F1010 Alcohol abuse, uncomplicated: Secondary | ICD-10-CM | POA: Diagnosis not present

## 2016-09-21 DIAGNOSIS — J410 Simple chronic bronchitis: Secondary | ICD-10-CM

## 2016-09-21 DIAGNOSIS — F17200 Nicotine dependence, unspecified, uncomplicated: Secondary | ICD-10-CM

## 2016-09-21 DIAGNOSIS — M159 Polyosteoarthritis, unspecified: Secondary | ICD-10-CM

## 2016-09-21 DIAGNOSIS — Z7289 Other problems related to lifestyle: Secondary | ICD-10-CM

## 2016-09-21 DIAGNOSIS — F1090 Alcohol use, unspecified, uncomplicated: Secondary | ICD-10-CM

## 2016-09-21 DIAGNOSIS — J439 Emphysema, unspecified: Secondary | ICD-10-CM | POA: Insufficient documentation

## 2016-09-21 DIAGNOSIS — Z77090 Contact with and (suspected) exposure to asbestos: Secondary | ICD-10-CM

## 2016-09-21 LAB — COMPREHENSIVE METABOLIC PANEL
ALT: 30 U/L (ref 0–53)
AST: 28 U/L (ref 0–37)
Albumin: 4.3 g/dL (ref 3.5–5.2)
Alkaline Phosphatase: 41 U/L (ref 39–117)
BUN: 14 mg/dL (ref 6–23)
CHLORIDE: 109 meq/L (ref 96–112)
CO2: 27 mEq/L (ref 19–32)
Calcium: 9.3 mg/dL (ref 8.4–10.5)
Creatinine, Ser: 0.98 mg/dL (ref 0.40–1.50)
GFR: 86.52 mL/min (ref >60.00)
GLUCOSE: 87 mg/dL (ref 70–99)
Potassium: 4.8 mEq/L (ref 3.5–5.1)
SODIUM: 137 meq/L (ref 135–145)
Total Bilirubin: 0.9 mg/dL (ref 0.2–1.2)
Total Protein: 6.7 g/dL (ref 6.0–8.3)

## 2016-09-21 LAB — FOLATE

## 2016-09-21 MED ORDER — VITAMIN B-1 100 MG PO TABS: 100.0000 mg | ORAL_TABLET | Freq: Every day | ORAL | 3 refills | Status: DC

## 2016-09-21 MED ORDER — GABAPENTIN 300 MG PO CAPS: 300.0000 mg | ORAL_CAPSULE | Freq: Two times a day (BID) | ORAL | 3 refills | Status: DC

## 2016-09-21 NOTE — Assessment & Plan Note (Signed)
Endorsing chronic cough along with mild dyspnea and wheezing. Offered return for spirometry to further eval COPD. Check CXR today. Encouraged smoking cessation.

## 2016-09-21 NOTE — Patient Instructions (Addendum)
Labs today.  Xray of chest today.  Best thing for health and to decrease risk of cancer is continued work towards quitting smoking. Let us know if you'd like to try wellbutrin Restart gabapentin 300mg  at night time and if tolerated may increase to twice daily.  Try backing off ibuprofen if gabapentin is effective.

## 2016-09-21 NOTE — Assessment & Plan Note (Addendum)
With LBP - restart gabapentin to treat presumed neuropathic component to arthritic pain as this was effective previously, hopeful to decrease NSAID use in significant alcohol use.

## 2016-09-21 NOTE — Assessment & Plan Note (Signed)
Continue to encourage cessation. Contemplative. Discussed wellbutrin - he will contact us if desires treatment.  Check CXR today.  Discussed lung cancer screening CT starting at age 49yo.

## 2016-09-21 NOTE — Assessment & Plan Note (Addendum)
Reviewed alcohol use and health concerns related to heavy drinking. Encouraged continued slow taper.  Check labs today.

## 2016-09-21 NOTE — Assessment & Plan Note (Signed)
Possible exposure ~2016. Encouraged he check with employer to see if he was truly exposed, rec they test site.  Discussed asbestos exposure and relation to mesothelioma and other lung cancers and long latency period prior to disease development. Will recommend lung cancer screening CT at age 51yo as he will be eligible for this. Check CXR today.  Strongly recommend quit smoking as most effective way to decrease future cancer risk.

## 2016-09-21 NOTE — Progress Notes (Signed)
Pre visit review using our clinic review tool, if applicable. No additional management support is needed unless otherwise documented below in the visit note. 

## 2016-09-21 NOTE — Progress Notes (Signed)
BP 110/76   Pulse 76   Temp 97.7 F (36.5 C) (Oral)   Wt 169 lb 8 oz (76.9 kg)   SpO2 95%   BMI 23.64 kg/m    CC: questions about asbestos exposure Subjective:    Patient ID: Kirk Bradley, male    DOB: 1966-06-20, 51 y.o.   MRN: [id number]  HPI: Kirk Bradley is a 51 y.o. male presenting on 09/21/2016 for Asbestos exposure (unknown if actually exposed or just worked in building that had it)   [place]ed in [place] [employer] for a year (2016). Recently told he may have had abestos exposure at this location.   Noticing some night sweats over the last few months. Ongoing smokers cough productive of mild phlegm. Mild dyspnea and wheezing. Trouble gaining weight.   No fevers/chills, hemoptysis, unexpected weight loss.   Smoking - 1+ ppd since age 51 yo. Has tried wellbutrin, patches, gum to help quit unsuccessfully.  Alcohol - drinking 12-18 beers every other day  Regularly takes ibuprofen 800mg  twice daily for general body aches  Relevant past medical, surgical, family and social history reviewed and updated as indicated. Interim medical history since our last visit reviewed. Allergies and medications reviewed and updated. Outpatient Medications Prior to Visit  Medication Sig Dispense Refill  . ibuprofen (ADVIL,MOTRIN) 800 MG tablet Take 1 tablet (800 mg total) by mouth 2 (two) times daily. 60 tablet 3  . gabapentin (NEURONTIN) 300 MG capsule Take 1 capsule (300 mg total) by mouth 3 (three) times daily. (Patient not taking: Reported on 09/21/2016) 90 capsule 3  . thiamine (VITAMIN B-1) 100 MG tablet Take 1 tablet (100 mg total) by mouth daily. (Patient not taking: Reported on 09/21/2016)     No facility-administered medications prior to visit.      Per HPI unless specifically indicated in ROS section below Review of Systems     Objective:    BP 110/76   Pulse 76   Temp 97.7 F (36.5 C) (Oral)   Wt 169 lb 8 oz (76.9 kg)   SpO2 95%   BMI 23.64  kg/m   Wt Readings from Last 3 Encounters:  09/21/16 169 lb 8 oz (76.9 kg)  04/21/15 163 lb 8 oz (74.2 kg)  01/21/15 167 lb (75.8 kg)    Physical Exam  Constitutional: He appears well-developed and well-nourished. No distress.  HENT:  Mouth/Throat: Oropharynx is clear and moist. No oropharyngeal exudate.  Eyes: Conjunctivae are normal. Pupils are equal, round, and reactive to light.  Cardiovascular: Normal rate, regular rhythm, normal heart sounds and intact distal pulses.   No murmur heard. Pulmonary/Chest: Effort normal and breath sounds normal. No respiratory distress. He has no wheezes. He has no rales.  Abdominal: Soft. Normal appearance and bowel sounds are normal. There is no hepatosplenomegaly. There is no tenderness. There is no rebound, no guarding and no CVA tenderness.  Musculoskeletal: He exhibits no edema.  Skin: Skin is warm and dry. No rash noted.  Psychiatric: He has a normal mood and affect.  Nursing note and vitals reviewed.  Results for orders placed or performed in visit on 04/25/15  Vitamin B1  Result Value Ref Range   Vitamin B1 (Thiamine) 7 (L) 8 - 30 nmol/L      Assessment & Plan:   Problem List Items Addressed This Visit    Asbestos exposure - Primary    Possible exposure ~2016. Encouraged he check with employer to see if he was truly exposed, rec they  test site.  Discussed asbestos exposure and relation to mesothelioma and other lung cancers and long latency period prior to disease development. Will recommend lung cancer screening CT at age 42yo as he will be eligible for this. Check CXR today.  Strongly recommend quit smoking as most effective way to decrease future cancer risk.       Relevant Orders   DG Chest 2 View   Habitual alcohol use    Reviewed alcohol use and health concerns related to heavy drinking. Encouraged continued slow taper.  Check labs today.       Relevant Orders   Folate   Vitamin B1   Comprehensive metabolic panel    Osteoarthritis    With LBP - restart gabapentin to treat presumed neuropathic component to arthritic pain as this was effective previously, hopeful to decrease NSAID use in significant alcohol use.       Smoker    Continue to encourage cessation. Contemplative. Discussed wellbutrin - he will contact us if desires treatment.  Check CXR today.  Discussed lung cancer screening CT starting at age 59yo.      Relevant Orders   DG Chest 2 View   Smokers' cough (HCC)    Endorsing chronic cough along with mild dyspnea and wheezing. Offered return for spirometry to further eval COPD. Check CXR today. Encouraged smoking cessation.       Relevant Orders   DG Chest 2 View       Follow up plan: No Follow-up on file.  Eustaquio Boyden, MD

## 2016-09-25 LAB — VITAMIN B1: Vitamin B1 (Thiamine): 9 nmol/L (ref 8–30)

## 2017-09-27 ENCOUNTER — Encounter: Payer: Self-pay | Admitting: Family Medicine

## 2017-09-27 ENCOUNTER — Ambulatory Visit (INDEPENDENT_AMBULATORY_CARE_PROVIDER_SITE_OTHER): Payer: BLUE CROSS/BLUE SHIELD | Admitting: Family Medicine

## 2017-09-27 VITALS — BP 120/84 | HR 70 | Temp 97.9°F | Ht 71.0 in | Wt 177.0 lb

## 2017-09-27 DIAGNOSIS — K219 Gastro-esophageal reflux disease without esophagitis: Secondary | ICD-10-CM

## 2017-09-27 DIAGNOSIS — M150 Primary generalized (osteo)arthritis: Secondary | ICD-10-CM

## 2017-09-27 DIAGNOSIS — Z1211 Encounter for screening for malignant neoplasm of colon: Secondary | ICD-10-CM | POA: Diagnosis not present

## 2017-09-27 DIAGNOSIS — Z7289 Other problems related to lifestyle: Secondary | ICD-10-CM

## 2017-09-27 DIAGNOSIS — F17200 Nicotine dependence, unspecified, uncomplicated: Secondary | ICD-10-CM | POA: Diagnosis not present

## 2017-09-27 DIAGNOSIS — F1090 Alcohol use, unspecified, uncomplicated: Secondary | ICD-10-CM

## 2017-09-27 DIAGNOSIS — Z0000 Encounter for general adult medical examination without abnormal findings: Secondary | ICD-10-CM | POA: Diagnosis not present

## 2017-09-27 DIAGNOSIS — F419 Anxiety disorder, unspecified: Secondary | ICD-10-CM | POA: Diagnosis not present

## 2017-09-27 DIAGNOSIS — M159 Polyosteoarthritis, unspecified: Secondary | ICD-10-CM

## 2017-09-27 MED ORDER — VITAMIN B-1 100 MG PO TABS: 100.0000 mg | ORAL_TABLET | Freq: Every day | ORAL | 3 refills | Status: DC

## 2017-09-27 MED ORDER — LORAZEPAM 0.5 MG PO TABS: 0.2500 mg | ORAL_TABLET | Freq: Two times a day (BID) | ORAL | 0 refills | Status: DC | PRN

## 2017-09-27 MED ORDER — PANTOPRAZOLE SODIUM 40 MG PO TBEC: 40.0000 mg | DELAYED_RELEASE_TABLET | Freq: Every day | ORAL | 6 refills | Status: DC

## 2017-09-27 MED ORDER — IBUPROFEN 800 MG PO TABS: 800.0000 mg | ORAL_TABLET | Freq: Two times a day (BID) | ORAL | 6 refills | Status: DC

## 2017-09-27 MED ORDER — CITALOPRAM HYDROBROMIDE 10 MG PO TABS: 10.0000 mg | ORAL_TABLET | Freq: Every day | ORAL | 6 refills | Status: DC

## 2017-09-27 MED ORDER — GABAPENTIN 300 MG PO CAPS: 300.0000 mg | ORAL_CAPSULE | Freq: Two times a day (BID) | ORAL | 3 refills | Status: DC

## 2017-09-27 NOTE — Assessment & Plan Note (Signed)
Preventative protocols reviewed and updated unless pt declined. Discussed healthy diet and lifestyle.  

## 2017-09-27 NOTE — Assessment & Plan Note (Addendum)
Chronic 1+ppd, >30PY hx.  Contemplating quitting, working on cutting down slowly.

## 2017-09-27 NOTE — Assessment & Plan Note (Signed)
He has cut down due to worsening GERD symptoms.

## 2017-09-27 NOTE — Patient Instructions (Addendum)
Pass by lab to pick up stool kit. Start celexa 64m daily for anxiety. May use ativan 1/2-1 tablet as needed for breakthrough anxiety/stress/sleep.  Work on healthy stress relieving strategies.  Return in 2 months for follow up visit.   Health Maintenance, Male A healthy lifestyle and preventive care is important for your health and wellness. Ask your health care provider about what schedule of regular examinations is right for you. What should I know about weight and diet? Eat a Healthy Diet  Eat plenty of vegetables, fruits, whole grains, low-fat dairy products, and lean protein.  Do not eat a lot of foods high in solid fats, added sugars, or salt.  Maintain a Healthy Weight Regular exercise can help you achieve or maintain a healthy weight. You should:  Do at least 150 minutes of exercise each week. The exercise should increase your heart rate and make you sweat (moderate-intensity exercise).  Do strength-training exercises at least twice a week.  Watch Your Levels of Cholesterol and Blood Lipids  Have your blood tested for lipids and cholesterol every 5 years starting at 50years of age. If you are at high risk for heart disease, you should start having your blood tested when you are 50years old. You may need to have your cholesterol levels checked more often if: ? Your lipid or cholesterol levels are high. ? You are older than 50years of age. ? You are at high risk for heart disease.  What should I know about cancer screening? Many types of cancers can be detected early and may often be prevented. Lung Cancer  You should be screened every year for lung cancer if: ? You are a current smoker who has smoked for at least 30 years. ? You are a former smoker who has quit within the past 15 years.  Talk to your health care provider about your screening options, when you should start screening, and how often you should be screened.  Colorectal Cancer  Routine colorectal cancer  screening usually begins at 50years of age and should be repeated every 5-10 years until you are 50years old. You may need to be screened more often if early forms of precancerous polyps or small growths are found. Your health care provider may recommend screening at an earlier age if you have risk factors for colon cancer.  Your health care provider may recommend using home test kits to check for hidden blood in the stool.  A small camera at the end of a tube can be used to examine your colon (sigmoidoscopy or colonoscopy). This checks for the earliest forms of colorectal cancer.  Prostate and Testicular Cancer  Depending on your age and overall health, your health care provider may do certain tests to screen for prostate and testicular cancer.  Talk to your health care provider about any symptoms or concerns you have about testicular or prostate cancer.  Skin Cancer  Check your skin from head to toe regularly.  Tell your health care provider about any new moles or changes in moles, especially if: ? There is a change in a mole's size, shape, or color. ? You have a mole that is larger than a pencil eraser.  Always use sunscreen. Apply sunscreen liberally and repeat throughout the day.  Protect yourself by wearing long sleeves, pants, a wide-brimmed hat, and sunglasses when outside.  What should I know about heart disease, diabetes, and high blood pressure?  If you are 142354years of age, have  your blood pressure checked every 3-5 years. If you are 33 years of age or older, have your blood pressure checked every year. You should have your blood pressure measured twice-once when you are at a hospital or clinic, and once when you are not at a hospital or clinic. Record the average of the two measurements. To check your blood pressure when you are not at a hospital or clinic, you can use: ? An automated blood pressure machine at a pharmacy. ? A home blood pressure monitor.  Talk to your  health care provider about your target blood pressure.  If you are between 38-62 years old, ask your health care provider if you should take aspirin to prevent heart disease.  Have regular diabetes screenings by checking your fasting blood sugar level. ? If you are at a normal weight and have a low risk for diabetes, have this test once every three years after the age of 15. ? If you are overweight and have a high risk for diabetes, consider being tested at a younger age or more often.  A one-time screening for abdominal aortic aneurysm (AAA) by ultrasound is recommended for men aged 6-75 years who are current or former smokers. What should I know about preventing infection? Hepatitis B If you have a higher risk for hepatitis B, you should be screened for this virus. Talk with your health care provider to find out if you are at risk for hepatitis B infection. Hepatitis C Blood testing is recommended for:  Everyone born from [birth year] through 1963.  Anyone with known risk factors for hepatitis C.  Sexually Transmitted Diseases (STDs)  You should be screened each year for STDs including gonorrhea and chlamydia if: ? You are sexually active and are younger than 50 years of age. ? You are older than 50 years of age and your health care provider tells you that you are at risk for this type of infection. ? Your sexual activity has changed since you were last screened and you are at an increased risk for chlamydia or gonorrhea. Ask your health care provider if you are at risk.  Talk with your health care provider about whether you are at high risk of being infected with HIV. Your health care provider may recommend a prescription medicine to help prevent HIV infection.  What else can I do?  Schedule regular health, dental, and eye exams.  Stay current with your vaccines (immunizations).  Do not use any tobacco products, such as cigarettes, chewing tobacco, and e-cigarettes. If you need help  quitting, ask your health care provider.  Limit alcohol intake to no more than 2 drinks per day. One drink equals 12 ounces of beer, 5 ounces of wine, or 1 ounces of hard liquor.  Do not use street drugs.  Do not share needles.  Ask your health care provider for help if you need support or information about quitting drugs.  Tell your health care provider if you often feel depressed.  Tell your health care provider if you have ever been abused or do not feel safe at home. This information is not intended to replace advice given to you by your health care provider. Make sure you discuss any questions you have with your health care provider. Document Released: 01/12/2008 Document Revised: 03/14/2016 Document Reviewed: 04/19/2015 Elsevier Interactive Patient Education  [employer].

## 2017-09-27 NOTE — Assessment & Plan Note (Signed)
Managed with gabapentin 300mg  bid and ibuprofen 800mg  bid. Refilled. See below.

## 2017-09-27 NOTE — Assessment & Plan Note (Addendum)
Worsening GERD symptoms have caused him to cut down on drinking. He is down to a few drinks a week. Takes 1 tums daily. Given ongoing regular NSAID use, will Rx daily PPI protonix for GI protection. No red flags.

## 2017-09-27 NOTE — Progress Notes (Signed)
BP 120/84 (BP Location: Left Arm, Patient Position: Sitting, Cuff Size: Normal)   Pulse 70   Temp 97.9 F (36.6 C) (Oral)   Ht '5\' 11"'  (1.803 m)   Wt 177 lb (80.3 kg)   SpO2 95%   BMI 24.69 kg/m   CC: CPE "I'd like xanax" Subjective:    Patient ID: Kirk Bradley, male    DOB: 26-May-1966, 52 y.o.   MRN: [id number]  HPI: Kirk Bradley is a 52 y.o. 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 This has increased anxiety. He's been taking 5-HTP plus without improvement. Doesn't have healthy stress relieving strategies. His friends take xanax and he wants this medication to help him manage stress and sleep.   Has cut down on drinking - down to a few drinks a week. Found alcohol caused worsening GERD and headaches. Takes 1 tums daily.   Preventative: Colon cancer screening - discussed - he would be interested in stool kit.  Flu shot - declines Tetanus - 06/2010  Seat belt use discussed Sunscreen use discussed. No changing moles on skin. Smoker - 1 ppd, working on cutting down.  Alcohol - a few drinks (beer or liquor) per week - significant decrease from prior  Lives with wife and 8 dogs Occupation: works in [employer]: G[employer] Activity: very active at work Diet: good water, fruits/vegetables daily  Relevant past medical, surgical, family and social history reviewed and updated as indicated. Interim medical history since our last visit reviewed. Allergies and medications reviewed and updated. Outpatient Medications Prior to Visit  Medication Sig Dispense Refill  . gabapentin (NEURONTIN) 300 MG capsule Take 1 capsule (300 mg total) by mouth 2 (two) times daily. 180 capsule 3  . ibuprofen (ADVIL,MOTRIN) 800 MG tablet Take 1 tablet (800 mg total) by mouth 2 (two) times daily. 60 tablet 3  . thiamine (VITAMIN B-1) 100 MG tablet Take 1 tablet (100 mg total) by mouth daily. 90 tablet 3   No facility-administered  medications prior to visit.      Per HPI unless specifically indicated in ROS section below Review of Systems  Constitutional: Negative for activity change, appetite change, chills, fatigue, fever and unexpected weight change.  HENT: Negative for hearing loss.   Eyes: Negative for visual disturbance.  Respiratory: Positive for cough and shortness of breath. Negative for chest tightness and wheezing.   Cardiovascular: Negative for chest pain, palpitations and leg swelling.  Gastrointestinal: Negative for abdominal distention, abdominal pain, blood in stool, constipation, diarrhea, nausea and vomiting.       GERD with alcohol No dysphagia  Genitourinary: Negative for difficulty urinating and hematuria.  Musculoskeletal: Negative for arthralgias, myalgias and neck pain.  Skin: Negative for rash.  Neurological: Positive for headaches. Negative for dizziness, seizures and syncope.  Hematological: Negative for adenopathy. Does not bruise/bleed easily.  Psychiatric/Behavioral: Negative for dysphoric mood. The patient is nervous/anxious (daily anxiety about work).       Objective:    BP 120/84 (BP Location: Left Arm, Patient Position: Sitting, Cuff Size: Normal)   Pulse 70   Temp 97.9 F (36.6 C) (Oral)   Ht '5\' 11"'  (1.803 m)   Wt 177 lb (80.3 kg)   SpO2 95%   BMI 24.69 kg/m   Wt Readings from Last 3 Encounters:  09/27/17 177 lb (80.3 kg)  09/21/16 169 lb 8 oz (76.9 kg)  04/21/15 163 lb 8 oz (74.2 kg)    Physical  Exam  Constitutional: He is oriented to person, place, and time. He appears well-developed and well-nourished. No distress.  HENT:  Head: Normocephalic and atraumatic.  Right Ear: Hearing, tympanic membrane, external ear and ear canal normal.  Left Ear: Hearing, tympanic membrane, external ear and ear canal normal.  Nose: Nose normal.  Mouth/Throat: Uvula is midline, oropharynx is clear and moist and mucous membranes are normal. No oropharyngeal exudate, posterior  oropharyngeal edema or posterior oropharyngeal erythema.  Eyes: Conjunctivae and EOM are normal. Pupils are equal, round, and reactive to light. No scleral icterus.  Neck: Normal range of motion. Neck supple. No thyromegaly present.  Cardiovascular: Normal rate, regular rhythm, normal heart sounds and intact distal pulses.  No murmur heard. Pulses:      Radial pulses are 2+ on the right side, and 2+ on the left side.  Pulmonary/Chest: Effort normal and breath sounds normal. No respiratory distress. He has no wheezes. He has no rales.  Abdominal: Soft. Bowel sounds are normal. He exhibits no distension and no mass. There is no tenderness. There is no rebound and no guarding.  Musculoskeletal: Normal range of motion. He exhibits no edema.  Lymphadenopathy:    He has no cervical adenopathy.  Neurological: He is alert and oriented to person, place, and time.  CN grossly intact, station and gait intact  Skin: Skin is warm and dry. No rash noted.  Psychiatric: He has a normal mood and affect. His behavior is normal. Judgment and thought content normal.  Nursing note and vitals reviewed.  Results for orders placed or performed in visit on 09/21/16  Folate  Result Value Ref Range   Folate >23.4 >5.9 ng/mL  Vitamin B1  Result Value Ref Range   Vitamin B1 (Thiamine) 9 8 - 30 nmol/L  Comprehensive metabolic panel  Result Value Ref Range   Sodium 137 135 - 145 mEq/L   Potassium 4.8 3.5 - 5.1 mEq/L   Chloride 109 96 - 112 mEq/L   CO2 27 19 - 32 mEq/L   Glucose, Bld 87 70 - 99 mg/dL   BUN 14 6 - 23 mg/dL   Creatinine, Ser 0.98 0.40 - 1.50 mg/dL   Total Bilirubin 0.9 0.2 - 1.2 mg/dL   Alkaline Phosphatase 41 39 - 117 U/L   AST 28 0 - 37 U/L   ALT 30 0 - 53 U/L   Total Protein 6.7 6.0 - 8.3 g/dL   Albumin 4.3 3.5 - 5.2 g/dL   Calcium 9.3 8.4 - 10.5 mg/dL   GFR 86.52 >60.00 mL/min      Assessment & Plan:   Problem List Items Addressed This Visit    Anxiety    Pt came into office  requesting xanax prescription.  Discussed risks of benzodiazepines including habit forming potential, additive nature of med, similarity to alcohol. rec against using benzo alone to manage anxiety.  Reviewed importance of healthy stress relieving strategies. Pt feels he is not at a point where he can do any of these due to work status.  Recommend start daily anxiety medication (celexa) and use ativan PRN anxiety, reviewed temporary use of benzo, advised not to take and drive due to possible sedation, advised to not mix with alcohol.  RTC 2 mo f/u visit.       Relevant Medications   citalopram (CELEXA) 10 MG tablet   LORazepam (ATIVAN) 0.5 MG tablet   GERD (gastroesophageal reflux disease)    Worsening GERD symptoms have caused him to cut down on drinking.  He is down to a few drinks a week. Takes 1 tums daily. Given ongoing regular NSAID use, will Rx daily PPI protonix for GI protection. No red flags.       Relevant Medications   pantoprazole (PROTONIX) 40 MG tablet   Habitual alcohol use    He has cut down due to worsening GERD symptoms.       Health maintenance examination - Primary    Preventative protocols reviewed and updated unless pt declined. Discussed healthy diet and lifestyle.       Osteoarthritis    Managed with gabapentin 379m bid and ibuprofen 8057mbid. Refilled. See below.       Relevant Medications   ibuprofen (ADVIL,MOTRIN) 800 MG tablet   Smoker    Chronic 1+ppd, >30PY hx.  Contemplating quitting, working on cutting down slowly.       Other Visit Diagnoses    Special screening for malignant neoplasms, colon       Relevant Orders   Fecal occult blood, imunochemical       Meds ordered this encounter  Medications  . gabapentin (NEURONTIN) 300 MG capsule    Sig: Take 1 capsule (300 mg total) by mouth 2 (two) times daily.    Dispense:  180 capsule    Refill:  3  . ibuprofen (ADVIL,MOTRIN) 800 MG tablet    Sig: Take 1 tablet (800 mg total) by mouth 2  (two) times daily.    Dispense:  60 tablet    Refill:  6  . thiamine (VITAMIN B-1) 100 MG tablet    Sig: Take 1 tablet (100 mg total) by mouth daily.    Dispense:  90 tablet    Refill:  3  . citalopram (CELEXA) 10 MG tablet    Sig: Take 1 tablet (10 mg total) by mouth daily.    Dispense:  30 tablet    Refill:  6  . pantoprazole (PROTONIX) 40 MG tablet    Sig: Take 1 tablet (40 mg total) by mouth daily.    Dispense:  30 tablet    Refill:  6  . LORazepam (ATIVAN) 0.5 MG tablet    Sig: Take 0.5-1 tablets (0.25-0.5 mg total) by mouth 2 (two) times daily as needed for anxiety.    Dispense:  30 tablet    Refill:  0   Orders Placed This Encounter  Procedures  . Fecal occult blood, imunochemical    Standing Status:   Future    Standing Expiration Date:   09/28/2018    Follow up plan: Return in about 2 months (around 11/27/2017) for annual exam, prior fasting for blood work.  JaRia BushMD

## 2017-09-27 NOTE — Assessment & Plan Note (Signed)
Pt came into office requesting xanax prescription.  Discussed risks of benzodiazepines including habit forming potential, additive nature of med, similarity to alcohol. rec against using benzo alone to manage anxiety.  Reviewed importance of healthy stress relieving strategies. Pt feels he is not at a point where he can do any of these due to work status.  Recommend start daily anxiety medication (celexa) and use ativan PRN anxiety, reviewed temporary use of benzo, advised not to take and drive due to possible sedation, advised to not mix with alcohol.  RTC 2 mo f/u visit.

## 2017-11-28 ENCOUNTER — Encounter: Payer: Self-pay | Admitting: Family Medicine

## 2017-11-28 ENCOUNTER — Ambulatory Visit (INDEPENDENT_AMBULATORY_CARE_PROVIDER_SITE_OTHER): Payer: BLUE CROSS/BLUE SHIELD | Admitting: Family Medicine

## 2017-11-28 VITALS — BP 136/90 | HR 72 | Temp 97.8°F | Ht 71.0 in | Wt 175.0 lb

## 2017-11-28 DIAGNOSIS — M5412 Radiculopathy, cervical region: Secondary | ICD-10-CM

## 2017-11-28 DIAGNOSIS — F419 Anxiety disorder, unspecified: Secondary | ICD-10-CM

## 2017-11-28 DIAGNOSIS — K219 Gastro-esophageal reflux disease without esophagitis: Secondary | ICD-10-CM

## 2017-11-28 DIAGNOSIS — F1090 Alcohol use, unspecified, uncomplicated: Secondary | ICD-10-CM

## 2017-11-28 DIAGNOSIS — Z7289 Other problems related to lifestyle: Secondary | ICD-10-CM

## 2017-11-28 MED ORDER — OMEPRAZOLE 40 MG PO CPDR: 40.0000 mg | DELAYED_RELEASE_CAPSULE | Freq: Every day | ORAL | 11 refills | Status: DC

## 2017-11-28 MED ORDER — PREDNISONE 20 MG PO TABS: ORAL_TABLET | ORAL | 0 refills | Status: DC

## 2017-11-28 MED ORDER — AMITRIPTYLINE HCL 25 MG PO TABS: 25.0000 mg | ORAL_TABLET | Freq: Every day | ORAL | 3 refills | Status: DC

## 2017-11-28 NOTE — Patient Instructions (Signed)
Price out omeprazole  daily in place of protonix (heartburn pill). I recommend continuing this daily. I do think you strained your neck and may have pinched nerve - treat with prednisone taper for the next week, do stretching exercises provided today. While you're on prednisone, hold ibuprofen.  Try amitriptyline (elavil)  at bedtime for sleep, anxiety, and shoulder/neck pain Let me know how you do with all of the above.  Try to back off alcohol.

## 2017-11-28 NOTE — Assessment & Plan Note (Signed)
Improved on PPI - requests cheaper med. Will price out omeprazole  daily.

## 2017-11-28 NOTE — Assessment & Plan Note (Addendum)
Ongoing work stressors. Denies depressed mood. Did not like how he felt on celexa. Will trial amitriptyline nightly. Don't recommend continued benzo (lorazepam) with his significant alcohol use.  PHQ9 = 11 GAD7 = 12

## 2017-11-28 NOTE — Assessment & Plan Note (Addendum)
Drinks several times a week, significant amount. Continue to encourage decreased use. Advised I'm worried about long term effect on liver health.

## 2017-11-28 NOTE — Assessment & Plan Note (Signed)
Acute L neck pain, benign exam. Red flags reviewed. Try prednisone taper, provided with neck stretching exercises. Update if not improving with treatment, consider imaging, further eval/treatment.

## 2017-11-28 NOTE — Progress Notes (Signed)
BP 136/90 (BP Location: Right Arm, Cuff Size: Normal)   Pulse 72   Temp 97.8 F (36.6 C) (Oral)   Ht  (1.803 m)   Wt 175 lb (79.4 kg)   SpO2 93%   BMI 24.41 kg/m    CC: anxiety 57mo f/u visit Subjective:    Patient ID: Kirk Bradley, male    DOB: [birth year], 52 y.o.   MRN: [id number]  HPI: Kirk Bradley is a 52 y.o. male presenting on 11/28/2017 for Anxiety (Here for 2 mo follow-up. ) and Discuss medication (Says pantoprazole works great but asking for something cheaper.)   See prior note for details. 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 He stopped after 1.5 weeks - "made me feel like a zombie". Lorazepam was helpful but he has run out of med. Continues drinking 2-3 times a week (18 pack).   GERD in setting of alcohol use and regular NSAID use - we started protonix with good effect however this medication is unaffordable.   Lifting sheet rock injury to L cervical neck and shoulder - with pain at neck and shoulder and paresthesias down arm. Denies numbness. This happened about 1.64mo. Treating with massage, ibuprofen, gabapentin (increased dose).   Relevant past medical, surgical, family and social history reviewed and updated as indicated. Interim medical history since our last visit reviewed. Allergies and medications reviewed and updated. Outpatient Medications Prior to Visit  Medication Sig Dispense Refill  . gabapentin (NEURONTIN) 300 MG capsule Take 1 capsule (300 mg total) by mouth 2 (two) times daily. (Patient taking differently: Take 300 mg by mouth 2 (two) times daily. 2 tablets twice a day) 180 capsule 3  . ibuprofen (ADVIL,MOTRIN) 800 MG tablet Take 1 tablet (800 mg total) by mouth 2 (two) times daily. 60 tablet 6  . thiamine (VITAMIN B-1) 100 MG tablet Take 1 tablet (100 mg total) by mouth daily. 90 tablet 3  . LORazepam (ATIVAN) 0.5 MG tablet Take 0.5-1 tablets (0.25-0.5 mg  total) by mouth 2 (two) times daily as needed for anxiety. 30 tablet 0  . pantoprazole (PROTONIX) 40 MG tablet Take 1 tablet (40 mg total) by mouth daily. 30 tablet 6  . citalopram (CELEXA) 10 MG tablet Take 1 tablet (10 mg total) by mouth daily. 30 tablet 6   No facility-administered medications prior to visit.      Per HPI unless specifically indicated in ROS section below Review of Systems     Objective:    BP 136/90 (BP Location: Right Arm, Cuff Size: Normal)   Pulse 72   Temp 97.8 F (36.6 C) (Oral)   Ht  (1.803 m)   Wt 175 lb (79.4 kg)   SpO2 93%   BMI 24.41 kg/m   Wt Readings from Last 3 Encounters:  11/28/17 175 lb (79.4 kg)  09/27/17 177 lb (80.3 kg)  09/21/16 169 lb 8 oz (76.9 kg)    Physical Exam  Constitutional: He appears well-developed and well-nourished. No distress.  Musculoskeletal: Normal range of motion.  FROM at neck and shoulders, some discomfort with end motion L shoulder movement No midline cervical spine tenderness Some L trap and L paracervical mm tenderness to palpation   Neurological: He is alert.  Tender with spurling test but no radiculopathy 5/5 strength BUE  Psychiatric: He has a normal mood and affect.  Nursing note and vitals reviewed.  Results for orders placed or  performed in visit on 09/21/16  Folate  Result Value Ref Range   Folate >23.4 >5.9 ng/mL  Vitamin B1  Result Value Ref Range   Vitamin B1 (Thiamine) 9 8 - 30 nmol/L  Comprehensive metabolic panel  Result Value Ref Range   Sodium 137 135 - 145 mEq/L   Potassium 4.8 3.5 - 5.1 mEq/L   Chloride 109 96 - 112 mEq/L   CO2 27 19 - 32 mEq/L   Glucose, Bld 87 70 - 99 mg/dL   BUN 14 6 - 23 mg/dL   Creatinine, Ser 1.61 0.40 - 1.50 mg/dL   Total Bilirubin 0.9 0.2 - 1.2 mg/dL   Alkaline Phosphatase 41 39 - 117 U/L   AST 28 0 - 37 U/L   ALT 30 0 - 53 U/L   Total Protein 6.7 6.0 - 8.3 g/dL   Albumin 4.3 3.5 - 5.2 g/dL   Calcium 9.3 8.4 - 09.6 mg/dL   GFR 04.54 >09.81  mL/min      Assessment & Plan:   Problem List Items Addressed This Visit    Anxiety - Primary    Ongoing work stressors. Denies depressed mood. Did not like how he felt on celexa. Will trial amitriptyline nightly. Don't recommend continued benzo (lorazepam) with his significant alcohol use.  PHQ9 = 11 GAD7 = 12      Relevant Medications   amitriptyline (ELAVIL) 25 MG tablet   GERD (gastroesophageal reflux disease)    Improved on PPI - requests cheaper med. Will price out omeprazole  daily.       Relevant Medications   omeprazole (PRILOSEC) 40 MG capsule   Habitual alcohol use    Drinks several times a week, significant amount. Continue to encourage decreased use. Advised I'm worried about long term effect on liver health.       Left cervical radiculopathy    Acute L neck pain, benign exam. Red flags reviewed. Try prednisone taper, provided with neck stretching exercises. Update if not improving with treatment, consider imaging, further eval/treatment.       Relevant Medications   amitriptyline (ELAVIL) 25 MG tablet       Meds ordered this encounter  Medications  . omeprazole (PRILOSEC) 40 MG capsule    Sig: Take 1 capsule (40 mg total) by mouth daily.    Dispense:  30 capsule    Refill:  11  . amitriptyline (ELAVIL) 25 MG tablet    Sig: Take 1 tablet (25 mg total) by mouth at bedtime.    Dispense:  30 tablet    Refill:  3  . predniSONE (DELTASONE) 20 MG tablet    Sig: Take two tablets daily for 3 days followed by one tablet daily for 4 days    Dispense:  10 tablet    Refill:  0   No orders of the defined types were placed in this encounter.   Follow up plan: Return if symptoms worsen or fail to improve.  Eustaquio Boyden, MD

## 2017-12-22 ENCOUNTER — Other Ambulatory Visit: Payer: Self-pay | Admitting: Family Medicine

## 2018-05-25 ENCOUNTER — Ambulatory Visit (HOSPITAL_COMMUNITY)
Admission: EM | Admit: 2018-05-25 | Discharge: 2018-05-25 | Disposition: A | Discharge status: Home / Self Care | Payer: BLUE CROSS/BLUE SHIELD | Attending: Family Medicine | Admitting: Family Medicine

## 2018-05-25 ENCOUNTER — Ambulatory Visit (INDEPENDENT_AMBULATORY_CARE_PROVIDER_SITE_OTHER): Payer: BLUE CROSS/BLUE SHIELD

## 2018-05-25 ENCOUNTER — Encounter (HOSPITAL_COMMUNITY): Payer: Self-pay

## 2018-05-25 DIAGNOSIS — Y92009 Unspecified place in unspecified non-institutional (private) residence as the place of occurrence of the external cause: Secondary | ICD-10-CM

## 2018-05-25 DIAGNOSIS — R0781 Pleurodynia: Secondary | ICD-10-CM | POA: Diagnosis not present

## 2018-05-25 DIAGNOSIS — R0789 Other chest pain: Secondary | ICD-10-CM | POA: Diagnosis not present

## 2018-05-25 DIAGNOSIS — W19XXXA Unspecified fall, initial encounter: Secondary | ICD-10-CM

## 2018-05-25 MED ORDER — HYDROCODONE-ACETAMINOPHEN 5-325 MG PO TABS: 1.0000 | ORAL_TABLET | Freq: Four times a day (QID) | ORAL | 0 refills | Status: DC | PRN

## 2018-05-25 MED ORDER — CYCLOBENZAPRINE HCL 10 MG PO TABS: ORAL_TABLET | ORAL | 0 refills | Status: DC

## 2018-05-25 NOTE — ED Triage Notes (Signed)
Pt presents with rib pain on left side aftter a fall injury.

## 2018-05-25 NOTE — ED Provider Notes (Signed)
[facility]    CSN: [id number] Arrival date & time: 05/25/18  1548     History   Chief Complaint Chief Complaint  Patient presents with  . Rib injury    HPI Kirk Bradley is a 52 y.o. male.   52 year old male accompanied by his wife with concern over injury to his left rib and chest area. He fell outside at his home 2 days ago and landed on the ground. He hit his left side of his back/chest and experienced immediate pain. He continues to have significant pain and has more shallow breathing due to pain with deep inspirations. He denies any head injury, LOC, vision changes, lower back pain, nausea, vomiting, dysuria or hematuria. He has taken Aleve with no relief. He does smoke cigarettes daily but has decreased significantly in the past 2 days since the accident. Other chronic health issues include arthritis and GERD and he currently takes Neurontin and Prilosec daily.   The history is provided by the patient and the spouse.    Past Medical History:  Diagnosis Date  . Alcohol abuse   . GERD (gastroesophageal reflux disease)   . Osteoarthritis   . Smoker     Patient Active Problem List   Diagnosis Date Noted  . Left cervical radiculopathy 11/28/2017  . Anxiety 09/27/2017  . Asbestos exposure 09/21/2016  . Smokers' cough (HCC) 09/21/2016  . Health maintenance examination 04/21/2015  . Osteoarthritis   . GERD (gastroesophageal reflux disease)   . Habitual alcohol use   . Smoker   . Right foot pain 02/09/2015    Past Surgical History:  Procedure Laterality Date  . NO PAST SURGERIES         Home Medications    Prior to Admission medications   Medication Sig Start Date End Date Taking? Authorizing Provider  cyclobenzaprine (FLEXERIL) 10 MG tablet Take 1/2 to 1 whole tablet by mouth every 8 hours as needed for muscle spasms. 05/25/18   Sudie Grumbling, NP  gabapentin (NEURONTIN) 300 MG capsule Take 1 capsule (300 mg total) by mouth 2 (two) times  daily. Patient taking differently: Take 300 mg by mouth 2 (two) times daily. 2 tablets twice a day 09/27/17   Eustaquio Boyden, MD  HYDROcodone-acetaminophen (NORCO/VICODIN) 5-325 MG tablet Take 1-2 tablets by mouth every 6 (six) hours as needed for up to 5 days for severe pain. 05/25/18 05/30/18  Sudie Grumbling, NP  omeprazole (PRILOSEC) 40 MG capsule Take 1 capsule (40 mg total) by mouth daily. 11/28/17   Eustaquio Boyden, MD  thiamine (VITAMIN B-1) 100 MG tablet Take 1 tablet (100 mg total) by mouth daily. 09/27/17   Eustaquio Boyden, MD    Family History Family History  Problem Relation Age of Onset  . Rheum arthritis Mother   . Pulmonary embolism Mother   . Alcoholism Father   . Alcoholism Maternal Grandfather   . Alcoholism Maternal Aunt   . Alcoholism Maternal Uncle   . Cancer Maternal Uncle        unsure, agent orange exposure in [place]  . CAD Neg Hx   . Diabetes Neg Hx     Social History Social History   Tobacco Use  . Smoking status: Current Every Day Smoker    Packs/day: 1.50    Types: Cigarettes    Start date: 07/30/1986  . Smokeless tobacco: Never Used  . Tobacco comment: 1-2 ppd  Substance Use Topics  . Alcohol use: Yes    Alcohol/week: 0.0  standard drinks    Comment: 12-18 pack beer/day  . Drug use: Yes    Comment: MJ - occasional     Allergies   Patient has no known allergies.   Review of Systems Review of Systems  Constitutional: Positive for activity change (due to pain). Negative for appetite change, chills, diaphoresis, fatigue and fever.  HENT: Negative for facial swelling, nosebleeds, sore throat and trouble swallowing.   Eyes: Negative for photophobia and visual disturbance.  Respiratory: Positive for cough, chest tightness and wheezing. Negative for shortness of breath.   Cardiovascular: Positive for chest pain (left rib pain). Negative for palpitations.  Gastrointestinal: Negative for nausea and vomiting.  Genitourinary: Negative for  decreased urine volume, difficulty urinating, dysuria, flank pain and hematuria.  Musculoskeletal: Positive for arthralgias, back pain, gait problem (due to rib pain) and myalgias. Negative for neck pain and neck stiffness.  Skin: Negative for color change, rash and wound.  Neurological: Negative for dizziness, tremors, seizures, syncope, facial asymmetry, speech difficulty, weakness, light-headedness, numbness and headaches.  Hematological: Negative for adenopathy. Does not bruise/bleed easily.     Physical Exam Triage Vital Signs ED Triage Vitals [05/25/18 1631]  Enc Vitals Group     BP (!) 143/98     Pulse Rate 95     Resp 18     Temp 97.6 F (36.4 C)     Temp Source Oral     SpO2 97 %     Weight      Height      Head Circumference      Peak Flow      Pain Score 10     Pain Loc      Pain Edu?      Excl. in GC?    No data found.  Updated Vital Signs BP (!) 143/98 (BP Location: Right Arm)   Pulse 95   Temp 97.6 F (36.4 C) (Oral)   Resp 18   SpO2 97%   Visual Acuity Right Eye Distance:   Left Eye Distance:   Bilateral Distance:    Right Eye Near:   Left Eye Near:    Bilateral Near:     Physical Exam  Constitutional: He is oriented to person, place, and time. He appears well-developed and well-nourished. He is cooperative. He appears ill. No distress.  Patient sitting in wheel chair in no acute distress but appears to be in pain, especially when trying to change positions. He has on a torn t-shirt and appears disheveled.   HENT:  Head: Normocephalic and atraumatic.  Right Ear: External ear normal.  Left Ear: External ear normal.  Eyes: Conjunctivae and EOM are normal.  Neck: Normal range of motion. Neck supple.  Cardiovascular: Normal rate, regular rhythm and normal heart sounds.  No murmur heard. Pulmonary/Chest: Effort normal. No accessory muscle usage or stridor. No tachypnea. No respiratory distress. He has decreased breath sounds in the right upper  field, the right lower field, the left upper field and the left lower field. He has wheezes in the left upper field and the left lower field. He has no rhonchi. He has no rales.     He exhibits tenderness. He exhibits no mass, no edema, no deformity, no swelling and no retraction.  Tender along the 8th and 9th rib on the left side of the thoracic area of his back. No distinct swelling, bruising or redness seen. No obvious deformity. No lower lumbar tenderness. Also tender along 10th rib on left side of  anterior of chest. Also no distinct swelling, redness or bruising present. No neuro deficits. Examined patient in wheel chair- patient too uncomfortable to move to exam table.     Musculoskeletal: He exhibits tenderness.  Neurological: He is alert and oriented to person, place, and time.  Skin: Skin is warm and dry. No rash noted.  Psychiatric: He has a normal mood and affect. His behavior is normal. Judgment and thought content normal.  Vitals reviewed.    UC Treatments / Results  Labs (all labs ordered are listed, but only abnormal results are displayed) Labs Reviewed - No data to display  EKG None  Radiology Dg Ribs Unilateral W/chest Left  Result Date: 05/25/2018 CLINICAL DATA:  Larey Seat 2 days ago.  Persistent left rib pain. EXAM: LEFT RIBS AND CHEST - 3+ VIEW COMPARISON:  Chest x-ray 09/21/2016 FINDINGS: The cardiac silhouette, mediastinal and hilar contours are within normal limits. The lungs are clear. No pleural effusion or pneumothorax. Dedicated views of the left ribs do not demonstrate any definite acute left-sided rib fractures. IMPRESSION: No acute cardiopulmonary findings and no definite acute left rib fractures. Electronically Signed   By: Rudie Meyer M.D.   On: 05/25/2018 17:48    Procedures Procedures (including critical care time)  Medications Ordered in UC Medications - No data to display  Initial Impression / Assessment and Plan / UC Course  I have reviewed the  triage vital signs and the nursing notes.  Pertinent labs & imaging results that were available during my care of the patient were reviewed by me and considered in my medical decision making (see chart for details).    Reviewed x-ray results with patient and wife. No distinct fracture. Discussed that he may have rib contusions or ligament strain. Will continue Aleve 2 every 12 hours as needed for pain. May take Flexeril 10mg  every 8 hours as directed. May also use Vicodin 1-2 tablets every 6 hours as needed for severe pain. Encouraged to take deep breaths every few hours to help prevent increased risk of pneumonia due to decreased activity. May apply warm compresses to area for comfort. Strongly encouraged to stop smoking. Follow-up with his PCP in 3 days if not improving or go to the ER if pain worsens.  Final Clinical Impressions(s) / UC Diagnoses   Final diagnoses:  Rib pain on left side  Fall at home, initial encounter     Discharge Instructions     Recommend take Aleve 2 tablet every 12 hours as needed for pain and inflammation. Recommend Flexeril 10mg - take 1/2 to 1 whole tablet every 8 hours as needed for muscle spasms. May use Vicodin 1 to 2 tablets every 6 hours as needed for severe pain. May apply warm compresses to area for comfort. Follow-up with your PCP in 3 days if not improving.     ED Prescriptions    Medication Sig Dispense Auth. Provider   cyclobenzaprine (FLEXERIL) 10 MG tablet Take 1/2 to 1 whole tablet by mouth every 8 hours as needed for muscle spasms. 21 tablet Caressa Scearce, Ali Lowe, NP   HYDROcodone-acetaminophen (NORCO/VICODIN) 5-325 MG tablet Take 1-2 tablets by mouth every 6 (six) hours as needed for up to 5 days for severe pain. 20 tablet Sudie Grumbling, NP     Controlled Substance Prescriptions [place] Controlled Substance Registry consulted? Yes, I have consulted the  Controlled Substances Registry for this patient, and feel the risk/benefit ratio today is  favorable for proceeding with this prescription for a controlled substance.  Last Rx was Ativan in 09/2017. No other active Rx listed.    Sudie Grumbling, NP 05/25/18 2251

## 2018-05-25 NOTE — Discharge Instructions (Addendum)
Recommend take Aleve 2 tablet every 12 hours as needed for pain and inflammation. Recommend Flexeril 10mg - take 1/2 to 1 whole tablet every 8 hours as needed for muscle spasms. May use Vicodin 1 to 2 tablets every 6 hours as needed for severe pain. May apply warm compresses to area for comfort. Follow-up with your PCP in 3 days if not improving.

## 2018-05-30 ENCOUNTER — Ambulatory Visit (INDEPENDENT_AMBULATORY_CARE_PROVIDER_SITE_OTHER): Payer: BLUE CROSS/BLUE SHIELD | Admitting: Internal Medicine

## 2018-05-30 VITALS — BP 126/84 | HR 94 | Temp 97.9°F | Wt 168.0 lb

## 2018-05-30 DIAGNOSIS — R0781 Pleurodynia: Secondary | ICD-10-CM

## 2018-05-30 DIAGNOSIS — S299XXA Unspecified injury of thorax, initial encounter: Secondary | ICD-10-CM | POA: Diagnosis not present

## 2018-05-30 DIAGNOSIS — W19XXXD Unspecified fall, subsequent encounter: Secondary | ICD-10-CM

## 2018-05-30 MED ORDER — HYDROCODONE-ACETAMINOPHEN 5-325 MG PO TABS: 1.0000 | ORAL_TABLET | Freq: Four times a day (QID) | ORAL | 0 refills | Status: AC | PRN

## 2018-05-30 MED ORDER — CYCLOBENZAPRINE HCL 10 MG PO TABS: ORAL_TABLET | ORAL | 0 refills | Status: DC

## 2018-05-31 ENCOUNTER — Encounter: Payer: Self-pay | Admitting: Internal Medicine

## 2018-05-31 NOTE — Patient Instructions (Signed)
Rib Contusion A rib contusion is a deep bruise on your rib area. Contusions are the result of a blunt trauma that causes bleeding and injury to the tissues under the skin. A rib contusion may involve bruising of the ribs and of the skin and muscles in the area. The skin overlying the contusion may turn blue, purple, or yellow. Minor injuries will give you a painless contusion, but more severe contusions may stay painful and swollen for a few weeks. What are the causes? A contusion is usually caused by a blow, trauma, or direct force to an area of the body. This often occurs while playing contact sports. What are the signs or symptoms?  Swelling and redness of the injured area.  Discoloration of the injured area.  Tenderness and soreness of the injured area.  Pain with or without movement. How is this diagnosed? The diagnosis can be made by taking a medical history and performing a physical exam. An X-ray, CT scan, or MRI may be needed to determine if there were any associated injuries, such as broken bones (fractures) or internal injuries. How is this treated? Often, the best treatment for a rib contusion is rest. Icing or applying cold compresses to the injured area may help reduce swelling and inflammation. Deep breathing exercises may be recommended to reduce the risk of partial lung collapse and pneumonia. Over-the-counter or prescription medicines may also be recommended for pain control. Follow these instructions at home:  Apply ice to the injured area: ? Put ice in a plastic bag. ? Place a towel between your skin and the bag. ? Leave the ice on for 20 minutes, 2-3 times per day.  Take medicines only as directed by your health care provider.  Rest the injured area. Avoid strenuous activity and any activities or movements that cause pain. Be careful during activities and avoid bumping the injured area.  Perform deep-breathing exercises as directed by your health care provider.  Do  not lift anything that is heavier than 5 lb (2.3 kg) until your health care provider approves.  Do not use any tobacco products, including cigarettes, chewing tobacco, or electronic cigarettes. If you need help quitting, ask your health care provider. Contact a health care provider if:  You have increased bruising or swelling.  You have pain that is not controlled with treatment.  You have a fever. Get help right away if:  You have difficulty breathing or shortness of breath.  You develop a continual cough, or you cough up thick or bloody sputum.  You feel sick to your stomach (nauseous), you throw up (vomit), or you have abdominal pain. This information is not intended to replace advice given to you by your health care provider. Make sure you discuss any questions you have with your health care provider. Document Released: 04/10/2001 Document Revised: 12/22/2015 Document Reviewed: 04/27/2014 Elsevier Interactive Patient Education  2018 Elsevier Inc.  

## 2018-05-31 NOTE — Progress Notes (Signed)
Subjective:    Patient ID: Kirk Bradley, male    DOB: Jul 12, 1966, 52 y.o.   MRN: [id number]  HPI  Patient presents to the clinic today for urgent care follow-up.  He reports approximately 1 week ago he was walking out in the yard and he tripped over something and fell.  He landed hard on his left side.  He describes the pain as sharp and stabbing.  The pain is worse with movement or taking a deep breath.  He went to urgent care 10/27 for the same.  X-ray of the ribs were negative for fracture, no noticeable pneumothorax.  He was treated with Vicodin and Flexeril.  He reports minimal improvement in his pain.  He reports he is out of both of these medications and would like a refill today.  He is on gabapentin for osteoarthritis.  Review of Systems      Past Medical History:  Diagnosis Date  . Alcohol abuse   . GERD (gastroesophageal reflux disease)   . Osteoarthritis   . Smoker     Current Outpatient Medications  Medication Sig Dispense Refill  . cyclobenzaprine (FLEXERIL) 10 MG tablet Take 1/2 to 1 whole tablet by mouth every 8 hours as needed for muscle spasms. 21 tablet 0  . gabapentin (NEURONTIN) 300 MG capsule Take 1 capsule (300 mg total) by mouth 2 (two) times daily. (Patient taking differently: Take 300 mg by mouth 2 (two) times daily. 2 tablets twice a day) 180 capsule 3  . HYDROcodone-acetaminophen (NORCO/VICODIN) 5-325 MG tablet Take 1 tablet by mouth every 6 (six) hours as needed for up to 5 days for severe pain. 20 tablet 0  . omeprazole (PRILOSEC) 40 MG capsule Take 1 capsule (40 mg total) by mouth daily. 30 capsule 11  . thiamine (VITAMIN B-1) 100 MG tablet Take 1 tablet (100 mg total) by mouth daily. 90 tablet 3   No current facility-administered medications for this visit.     No Known Allergies  Family History  Problem Relation Age of Onset  . Rheum arthritis Mother   . Pulmonary embolism Mother   . Alcoholism Father   . Alcoholism Maternal Grandfather     . Alcoholism Maternal Aunt   . Alcoholism Maternal Uncle   . Cancer Maternal Uncle        unsure, agent orange exposure in [place]  . CAD Neg Hx   . Diabetes Neg Hx     Social History   Socioeconomic History  . Marital status: Married    Spouse name: Not on file  . Number of children: Not on file  . Years of education: Not on file  . Highest education level: Not on file  Occupational History  . Not on file  Social Needs  . Financial resource strain: Not on file  . Food insecurity:    Worry: Not on file    Inability: Not on file  . Transportation needs:    Medical: Not on file    Non-medical: Not on file  Tobacco Use  . Smoking status: Current Every Day Smoker    Packs/day: 1.50    Types: Cigarettes    Start date: 07/30/1986  . Smokeless tobacco: Never Used  . Tobacco comment: 1-2 ppd  Substance and Sexual Activity  . Alcohol use: Yes    Alcohol/week: 0.0 standard drinks    Comment: 12-18 pack beer/day  . Drug use: Yes    Comment: MJ - occasional  . Sexual activity: Not on file  Lifestyle  . Physical activity:    Days per week: Not on file    Minutes per session: Not on file  . Stress: Not on file  Relationships  . Social connections:    Talks on phone: Not on file    Gets together: Not on file    Attends religious service: Not on file    Active member of club or organization: Not on file    Attends meetings of clubs or organizations: Not on file    Relationship status: Not on file  . Intimate partner violence:    Fear of current or ex partner: Not on file    Emotionally abused: Not on file    Physically abused: Not on file    Forced sexual activity: Not on file  Other Topics Concern  . Not on file  Social History Narrative   Lives with wife and 8 dogs   Occupation: works in Architect   Edu: GED   Activity: very active at work   Diet: good water, fruits/vegetables daily     Constitutional: Denies fever, malaise, fatigue, headache or abrupt weight  changes.  Respiratory: Denies difficulty breathing, shortness of breath, cough or sputum production.   Cardiovascular: Denies chest pain, chest tightness, palpitations or swelling in the hands or feet.  Gastrointestinal: Denies abdominal pain, bloating, constipation, diarrhea or blood in the stool.  GU: Denies urgency, frequency, pain with urination, burning sensation, blood in urine, odor or discharge. Musculoskeletal: Pt reports left sided rib pain. Denies decrease in range of motion, difficulty with gait,  or joint pain and swelling.  Skin: Denies bruising, redness, rashes, lesions or ulcercations.    No other specific complaints in a complete review of systems (except as listed in HPI above).  Objective:   Physical Exam  BP 126/84   Pulse 94   Temp 97.9 F (36.6 C) (Oral)   Wt 168 lb (76.2 kg)   SpO2 96%   BMI 23.43 kg/m  Wt Readings from Last 3 Encounters:  05/30/18 168 lb (76.2 kg)  11/28/17 175 lb (79.4 kg)  09/27/17 177 lb (80.3 kg)    General: Appears his stated age, well developed, well nourished in NAD. Skin: Warm, dry and intact. No bruising or abrasion noted. Cardiovascular: Normal rate and rhythm. S1,S2 noted.  No murmur, rubs or gallops noted. Pulmonary/Chest: Normal effort and positive vesicular breath sounds. No respiratory distress. No wheezes, rales or ronchi noted.  Abdomen: Soft and nontender. Normal bowel sounds.  Musculoskeletal: Posterior lateral ribs tender, popping with palpation. Neurological: Alert and oriented.   BMET    Component Value Date/Time   NA 137 09/21/2016 1313   K 4.8 09/21/2016 1313   CL 109 09/21/2016 1313   CO2 27 09/21/2016 1313   GLUCOSE 87 09/21/2016 1313   BUN 14 09/21/2016 1313   CREATININE 0.98 09/21/2016 1313   CALCIUM 9.3 09/21/2016 1313   GFRNONAA >60 07/15/2010 0430   GFRAA  07/15/2010 0430    >60        The eGFR has been calculated using the MDRD equation. This calculation has not been validated in all  clinical situations. eGFR's persistently <60 mL/min signify possible Chronic Kidney Disease.    Lipid Panel     Component Value Date/Time   CHOL 180 04/21/2015 1616   TRIG 102.0 04/21/2015 1616   HDL 64.50 04/21/2015 1616   CHOLHDL 3 04/21/2015 1616   VLDL 20.4 04/21/2015 1616   LDLCALC 95 04/21/2015 1616  CBC    Component Value Date/Time   WBC 8.0 04/21/2015 1616   RBC 4.44 04/21/2015 1616   HGB 14.4 04/21/2015 1616   HCT 43.7 04/21/2015 1616   PLT 214.0 04/21/2015 1616   MCV 98.4 04/21/2015 1616   MCH 30.7 07/15/2010 0430   MCHC 32.9 04/21/2015 1616   RDW 13.3 04/21/2015 1616   LYMPHSABS 2.8 04/21/2015 1616   MONOABS 0.7 04/21/2015 1616   EOSABS 0.3 04/21/2015 1616   BASOSABS 0.1 04/21/2015 1616    Hgb A1C No results found for: HGBA1C          Assessment & Plan:  UC Follow up for Left Side Rib Pain s/p Fall:  Urgent care notes and imaging reviewed. Advised him this is something that will just take time to heal. Advised him to avoid heavy lifting until his pain has improved. We will refill Flexeril and Vicodin today He agrees that after these prescriptions run out.  That he should manage with Tylenol or ibuprofen over-the-counter. Encouraged ice Discussed splinting with coughing or sneezing  Return precautions discussed Webb Silversmith, NP

## 2018-09-11 ENCOUNTER — Telehealth: Payer: Self-pay | Admitting: Family Medicine

## 2018-09-11 NOTE — Telephone Encounter (Signed)
Pt's spouse Bronson Curb is calling and stated the pt want to [facility] from Dr Reece Agar to [facility]. Pt just want to switch. Please advise

## 2018-09-11 NOTE — Telephone Encounter (Signed)
Ok by me if ok by [place].

## 2018-09-11 NOTE — Telephone Encounter (Signed)
Ok with me 

## 2018-09-12 NOTE — Telephone Encounter (Signed)
Left message asking pt to call office  °

## 2018-09-15 NOTE — Telephone Encounter (Signed)
Appointment 3/24

## 2018-10-02 ENCOUNTER — Encounter: Payer: BLUE CROSS/BLUE SHIELD | Admitting: Family Medicine

## 2018-10-20 ENCOUNTER — Telehealth: Payer: Self-pay

## 2018-10-20 NOTE — Telephone Encounter (Signed)
Left message on voicemail Pt needs to r/s his TOC appt for tomorrow to June

## 2018-10-21 ENCOUNTER — Ambulatory Visit: Payer: Self-pay | Admitting: Internal Medicine

## 2018-10-21 NOTE — Telephone Encounter (Signed)
Called pt's number and Kathryn's number no answer and lmovm to r/s appt

## 2018-11-18 ENCOUNTER — Other Ambulatory Visit: Payer: Self-pay | Admitting: Family Medicine

## 2018-12-30 ENCOUNTER — Other Ambulatory Visit: Payer: Self-pay | Admitting: Family Medicine

## 2019-01-01 NOTE — Telephone Encounter (Signed)
Per note in February patient wanted to switch to Nicki Reaper, NP. [facility] appointment was cancelled in March and Shawna Orleans has tried to reach patient to r/s. How to proceed on this refill? And does TOC needs to be in person or virtual. Will send to Dr. Reece Agar and Shawna Orleans to review. Thank you

## 2020-03-06 ENCOUNTER — Other Ambulatory Visit: Payer: Self-pay | Admitting: Family Medicine

## 2020-03-08 NOTE — Telephone Encounter (Signed)
Gabapentin Last filled:  09/22/19, #[id number] Last OV:  09/27/17, CPE Next OV:  04/18/20, CPE

## 2020-04-12 ENCOUNTER — Other Ambulatory Visit: Payer: Self-pay | Admitting: Family Medicine

## 2020-04-12 ENCOUNTER — Other Ambulatory Visit: Payer: Self-pay

## 2020-04-12 ENCOUNTER — Other Ambulatory Visit (INDEPENDENT_AMBULATORY_CARE_PROVIDER_SITE_OTHER): Payer: Self-pay

## 2020-04-12 DIAGNOSIS — Z1159 Encounter for screening for other viral diseases: Secondary | ICD-10-CM

## 2020-04-12 DIAGNOSIS — F1090 Alcohol use, unspecified, uncomplicated: Secondary | ICD-10-CM

## 2020-04-12 DIAGNOSIS — Z125 Encounter for screening for malignant neoplasm of prostate: Secondary | ICD-10-CM

## 2020-04-12 DIAGNOSIS — E519 Thiamine deficiency, unspecified: Secondary | ICD-10-CM

## 2020-04-12 DIAGNOSIS — Z7289 Other problems related to lifestyle: Secondary | ICD-10-CM

## 2020-04-12 LAB — COMPREHENSIVE METABOLIC PANEL
ALT: 22 U/L (ref 0–53)
AST: 36 U/L (ref 0–37)
Albumin: 4.4 g/dL (ref 3.5–5.2)
Alkaline Phosphatase: 43 U/L (ref 39–117)
BUN: 8 mg/dL (ref 6–23)
CO2: 26 mEq/L (ref 19–32)
Calcium: 9.1 mg/dL (ref 8.4–10.5)
Chloride: 106 mEq/L (ref 96–112)
Creatinine, Ser: 1 mg/dL (ref 0.40–1.50)
GFR: 78.39 mL/min (ref >60.00)
Glucose, Bld: 79 mg/dL (ref 70–99)
Potassium: 4.7 mEq/L (ref 3.5–5.1)
Sodium: 139 mEq/L (ref 135–145)
Total Bilirubin: 1 mg/dL (ref 0.2–1.2)
Total Protein: 6.7 g/dL (ref 6.0–8.3)

## 2020-04-12 LAB — CBC WITH DIFFERENTIAL/PLATELET
Basophils Absolute: 0 10*3/uL (ref 0.0–0.1)
Basophils Relative: 0.9 % (ref 0.0–3.0)
Eosinophils Absolute: 0.1 10*3/uL (ref 0.0–0.7)
Eosinophils Relative: 1.4 % (ref 0.0–5.0)
HCT: 39.5 % (ref 39.0–52.0)
Hemoglobin: 13.3 g/dL (ref 13.0–17.0)
Lymphocytes Relative: 43.8 % (ref 12.0–46.0)
Lymphs Abs: 2.2 10*3/uL (ref 0.7–4.0)
MCHC: 33.6 g/dL (ref 30.0–36.0)
MCV: 103.9 fl — ABNORMAL HIGH (ref 78.0–100.0)
Monocytes Absolute: 0.6 10*3/uL (ref 0.1–1.0)
Monocytes Relative: 10.9 % (ref 3.0–12.0)
Neutro Abs: 2.2 10*3/uL (ref 1.4–7.7)
Neutrophils Relative %: 43 % (ref 43.0–77.0)
Platelets: 210 10*3/uL (ref 150.0–400.0)
RBC: 3.81 Mil/uL — ABNORMAL LOW (ref 4.22–5.81)
RDW: 12.7 % (ref 11.5–15.5)
WBC: 5.1 10*3/uL (ref 4.0–10.5)

## 2020-04-12 LAB — PSA: PSA: 1.27 ng/mL (ref 0.10–4.00)

## 2020-04-17 LAB — VITAMIN B1: Vitamin B1 (Thiamine): <6 nmol/L — ABNORMAL LOW (ref 8–30)

## 2020-04-17 LAB — HEPATITIS C ANTIBODY
Hepatitis C Ab: NONREACTIVE
SIGNAL TO CUT-OFF: 0.01 (ref <1.00)

## 2020-04-18 ENCOUNTER — Encounter: Payer: Self-pay | Admitting: Family Medicine

## 2020-04-18 ENCOUNTER — Ambulatory Visit (INDEPENDENT_AMBULATORY_CARE_PROVIDER_SITE_OTHER): Payer: Self-pay | Admitting: Family Medicine

## 2020-04-18 ENCOUNTER — Other Ambulatory Visit: Payer: Self-pay

## 2020-04-18 VITALS — BP 128/84 | HR 93 | Temp 97.8°F | Ht 70.0 in | Wt 171.3 lb

## 2020-04-18 DIAGNOSIS — Z1211 Encounter for screening for malignant neoplasm of colon: Secondary | ICD-10-CM

## 2020-04-18 DIAGNOSIS — E519 Thiamine deficiency, unspecified: Secondary | ICD-10-CM

## 2020-04-18 DIAGNOSIS — Z7289 Other problems related to lifestyle: Secondary | ICD-10-CM

## 2020-04-18 DIAGNOSIS — F17200 Nicotine dependence, unspecified, uncomplicated: Secondary | ICD-10-CM

## 2020-04-18 DIAGNOSIS — Z0000 Encounter for general adult medical examination without abnormal findings: Secondary | ICD-10-CM

## 2020-04-18 DIAGNOSIS — F1090 Alcohol use, unspecified, uncomplicated: Secondary | ICD-10-CM

## 2020-04-18 DIAGNOSIS — K219 Gastro-esophageal reflux disease without esophagitis: Secondary | ICD-10-CM

## 2020-04-18 MED ORDER — B-12 1000 MCG SL SUBL: 1.0000 | SUBLINGUAL_TABLET | Freq: Every day | SUBLINGUAL | Status: DC

## 2020-04-18 MED ORDER — CYCLOBENZAPRINE HCL 10 MG PO TABS: 5.0000 mg | ORAL_TABLET | Freq: Two times a day (BID) | ORAL | 3 refills | Status: DC | PRN

## 2020-04-18 MED ORDER — OMEPRAZOLE 20 MG PO CPDR
20.0000 mg | DELAYED_RELEASE_CAPSULE | Freq: Every day | ORAL | Status: DC
Start: 2020-04-18 — End: 2021-08-28

## 2020-04-18 MED ORDER — B-1 100 MG PO TABS: 1.0000 | ORAL_TABLET | Freq: Every day | ORAL | 3 refills | Status: DC

## 2020-04-18 NOTE — Assessment & Plan Note (Addendum)
Encouraged limiting alcohol. Reviewed health risks of long term alcohol use.  rec start daily MVI.

## 2020-04-18 NOTE — Progress Notes (Signed)
This visit was conducted in person.  BP 128/84 (BP Location: Right Arm, Patient Position: Sitting, Cuff Size: Large)   Pulse 93   Temp 97.8 F (36.6 C) (Temporal)   Ht _0  (1.778 m)   Wt 171 lb 5 oz (77.7 kg)   SpO2 94%   BMI 24.58 kg/m    CC: CPE Subjective:    Patient ID: Kirk Bradley, male    DOB: [birth year], 54 y.o.   MRN: [id number]  HPI: Kirk Bradley is a 53 y.o. male presenting on 04/18/2020 for Annual Exam   Requests flexeril refilled PRN due to physical labor.  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   Preventative: Colon cancer screening - discussed - requests iFOB today.  Prostate cancer - discussed, will check with PSA Lung cancer screening - eligible  Flu shot - declines COVID vaccine - Pfizer 10/29/2019 x2 Td - 06/2010  shingrix - discussed.  Seat belt use discussed Sunscreen use discussed. No changing moles on skin. Smoker - 1 ppd, started age 69yo  Alcohol - intermittent drinking - up to 18 pack in 1 sitting  rec drugs - MJ almost daily Dentist yearly Eye exam yearly  Lives with wife and 8 dogs Occupation: works in [employer]: G[employer] Activity: very active at work Diet: good water, fruits/vegetables daily     Relevant past medical, surgical, family and social history reviewed and updated as indicated. Interim medical history since our last visit reviewed. Allergies and medications reviewed and updated. Outpatient Medications Prior to Visit  Medication Sig Dispense Refill  . gabapentin (NEURONTIN) 300 MG capsule TAKE 1 CAPSULE BY MOUTH TWICE A DAY 180 capsule 3  . omeprazole (PRILOSEC) 40 MG capsule Take 1 capsule (40 mg total) by mouth daily. 30 capsule 11  . cyclobenzaprine (FLEXERIL) 10 MG tablet Take 1/2 to 1 whole tablet by mouth every 8 hours as needed for muscle spasms. 21 tablet 0  . Thiamine HCl (B-1) 100 MG TABS TAKE 1 TABLET BY MOUTH EVERY DAY 100 tablet 1   No facility-administered medications prior to visit.      Per HPI unless specifically indicated in ROS section below Review of Systems  Constitutional: Negative for activity change, appetite change, chills, fatigue, fever and unexpected weight change.  HENT: Negative for hearing loss.   Eyes: Negative for visual disturbance.  Respiratory: Negative for cough, chest tightness, shortness of breath and wheezing.   Cardiovascular: Negative for chest pain, palpitations and leg swelling.  Gastrointestinal: Negative for abdominal distention, abdominal pain, blood in stool, constipation, diarrhea, nausea and vomiting.  Genitourinary: Negative for difficulty urinating and hematuria.  Musculoskeletal: Negative for arthralgias, myalgias and neck pain.  Skin: Negative for rash.  Neurological: Negative for dizziness, seizures, syncope and headaches.  Hematological: Negative for adenopathy. Does not bruise/bleed easily.  Psychiatric/Behavioral: Negative for dysphoric mood. The patient is not nervous/anxious.    Objective:  BP 128/84 (BP Location: Right Arm, Patient Position: Sitting, Cuff Size: Large)   Pulse 93   Temp 97.8 F (36.6 C) (Temporal)   Ht _1  (1.778 m)   Wt 171 lb 5 oz (77.7 kg)   SpO2 94%   BMI 24.58 kg/m   Wt Readings from Last 3 Encounters:  04/18/20 171 lb 5 oz (77.7 kg)  05/30/18 168 lb (76.2 kg)  11/28/17 175 lb (79.4 kg)      Physical Exam Vitals and nursing note reviewed.  Constitutional:      General: He is not in acute distress.  Appearance: Normal appearance. He is well-developed. He is not ill-appearing.  HENT:     Head: Normocephalic and atraumatic.     Right Ear: Hearing, tympanic membrane, ear canal and external ear normal.     Left Ear: Hearing, tympanic membrane, ear canal and external ear normal.  Eyes:     General: No scleral icterus.    Extraocular Movements: Extraocular movements intact.     Conjunctiva/sclera: Conjunctivae normal.     Pupils: Pupils are equal, round, and reactive to light.  Neck:      Thyroid: No thyroid mass or thyromegaly.     Vascular: No carotid bruit.  Cardiovascular:     Rate and Rhythm: Normal rate and regular rhythm.     Pulses: Normal pulses.          Radial pulses are 2+ on the right side and 2+ on the left side.     Heart sounds: Normal heart sounds. No murmur heard.   Pulmonary:     Effort: Pulmonary effort is normal. No respiratory distress.     Breath sounds: Normal breath sounds. No wheezing, rhonchi or rales.  Abdominal:     General: Abdomen is flat. Bowel sounds are normal. There is no distension.     Palpations: Abdomen is soft. There is no mass.     Tenderness: There is no abdominal tenderness. There is no guarding or rebound.     Hernia: No hernia is present.  Musculoskeletal:        General: Normal range of motion.     Cervical back: Normal range of motion and neck supple.     Right lower leg: No edema.     Left lower leg: No edema.  Lymphadenopathy:     Cervical: No cervical adenopathy.  Skin:    General: Skin is warm and dry.     Findings: No rash.  Neurological:     General: No focal deficit present.     Mental Status: He is alert and oriented to person, place, and time.     Comments: CN grossly intact, station and gait intact  Psychiatric:        Mood and Affect: Mood normal.        Behavior: Behavior normal.        Thought Content: Thought content normal.        Judgment: Judgment normal.       Results for orders placed or performed in visit on 04/12/20  PSA  Result Value Ref Range   PSA 1.27 0.10 - 4.00 ng/mL  Hepatitis C antibody  Result Value Ref Range   Hepatitis C Ab NON-REACTIVE NON-REACTI   SIGNAL TO CUT-OFF 0.01 <1.00  CBC with Differential/Platelet  Result Value Ref Range   WBC 5.1 4.0 - 10.5 K/uL   RBC 3.81 (L) 4.22 - 5.81 Mil/uL   Hemoglobin 13.3 13.0 - 17.0 g/dL   HCT 39.5 39 - 52 %   MCV 103.9 (H) 78.0 - 100.0 fl   MCHC 33.6 30.0 - 36.0 g/dL   RDW 12.7 11.5 - 15.5 %   Platelets 210.0 150 - 400 K/uL    Neutrophils Relative % 43.0 43 - 77 %   Lymphocytes Relative 43.8 12 - 46 %   Monocytes Relative 10.9 3 - 12 %   Eosinophils Relative 1.4 0 - 5 %   Basophils Relative 0.9 0 - 3 %   Neutro Abs 2.2 1.4 - 7.7 K/uL   Lymphs Abs 2.2 0.7 - 4.0  K/uL   Monocytes Absolute 0.6 0 - 1 K/uL   Eosinophils Absolute 0.1 0 - 0 K/uL   Basophils Absolute 0.0 0 - 0 K/uL  Comprehensive metabolic panel  Result Value Ref Range   Sodium 139 135 - 145 mEq/L   Potassium 4.7 3.5 - 5.1 mEq/L   Chloride 106 96 - 112 mEq/L   CO2 26 19 - 32 mEq/L   Glucose, Bld 79 70 - 99 mg/dL   BUN 8 6 - 23 mg/dL   Creatinine, Ser 1.00 0.40 - 1.50 mg/dL   Total Bilirubin 1.0 0.2 - 1.2 mg/dL   Alkaline Phosphatase 43 39 - 117 U/L   AST 36 0 - 37 U/L   ALT 22 0 - 53 U/L   Total Protein 6.7 6.0 - 8.3 g/dL   Albumin 4.4 3.5 - 5.2 g/dL   GFR 78.39 >60.00 mL/min   Calcium 9.1 8.4 - 10.5 mg/dL  Vitamin B1  Result Value Ref Range   Vitamin B1 (Thiamine) <6 (L) 8 - 30 nmol/L   Assessment & Plan:  This visit occurred during the SARS-CoV-2 public health emergency.  Safety protocols were in place, including screening questions prior to the visit, additional usage of staff PPE, and extensive cleaning of exam room while observing appropriate contact time as indicated for disinfecting solutions.   Problem List Items Addressed This Visit    Vitamin B1 deficiency    rec restart thiamine daily.       Smoker    Continue to encourage smoking cessation. Eligible for lung cancer screening program, interested - will refer.       Relevant Orders   Ambulatory Referral for Lung Cancer Scre   Health maintenance examination - Primary    Preventative protocols reviewed and updated unless pt declined. Discussed healthy diet and lifestyle.       Habitual alcohol use    Encouraged limiting alcohol. Reviewed health risks of long term alcohol use.  rec start daily MVI.       GERD (gastroesophageal reflux disease)    Managed with  omeprazole 15m daily.       Relevant Medications   omeprazole (PRILOSEC) 20 MG capsule    Other Visit Diagnoses    Special screening for malignant neoplasms, colon       Relevant Orders   Fecal occult blood, imunochemical       Meds ordered this encounter  Medications  . cyclobenzaprine (FLEXERIL) 10 MG tablet    Sig: Take 0.5-1 tablets (5-10 mg total) by mouth 2 (two) times daily as needed for muscle spasms.    Dispense:  30 tablet    Refill:  3  . Thiamine HCl (B-1) 100 MG TABS    Sig: Take 1 tablet (100 mg total) by mouth daily.    Dispense:  100 tablet    Refill:  3  . Cyanocobalamin (B-12) 1000 MCG SUBL    Sig: Place 1 tablet under the tongue daily.  .Marland Kitchenomeprazole (PRILOSEC) 20 MG capsule    Sig: Take 1 capsule (20 mg total) by mouth daily.   Orders Placed This Encounter  Procedures  . Fecal occult blood, imunochemical    Standing Status:   Future    Standing Expiration Date:   04/18/2021  . Ambulatory Referral for Lung Cancer Scre    Referral Priority:   Routine    Referral Type:   Consultation    Referral Reason:   Specialty Services Required    Number of Visits  Requested:   1    Patient instructions: Pass by lab to pick up stool kit.  Consider shingrix vaccine.  We will refer you to lung cancer screening CT program - expect a phone call.  Restart vitamin B1 and B12 daily. Alcohol contributes to deficiencies of vitamins. Work on decreased alcohol.  Return as needed or in 1 year for next physical.   Follow up plan: Return in about 1 year (around 04/18/2021) for annual exam, prior fasting for blood work.  Ria Bush, MD

## 2020-04-18 NOTE — Assessment & Plan Note (Signed)
Preventative protocols reviewed and updated unless pt declined. Discussed healthy diet and lifestyle.  

## 2020-04-18 NOTE — Assessment & Plan Note (Signed)
Continue to encourage smoking cessation. Eligible for lung cancer screening program, interested - will refer.

## 2020-04-18 NOTE — Assessment & Plan Note (Signed)
Managed with omeprazole 20 mg daily  

## 2020-04-18 NOTE — Patient Instructions (Addendum)
Pass by lab to pick up stool kit.  Consider shingrix vaccine.  We will refer you to lung cancer screening CT program - expect a phone call.  Restart vitamin B1 and B12 daily. Consider multivitamin daily. Alcohol contributes to deficiencies of vitamins. Work on decreased alcohol.  Return as needed or in 1 year for next physical.   Health Maintenance, Male Adopting a healthy lifestyle and getting preventive care are important in promoting health and wellness. Ask your health care provider about:  The right schedule for you to have regular tests and exams.  Things you can do on your own to prevent diseases and keep yourself healthy. What should I know about diet, weight, and exercise? Eat a healthy diet   Eat a diet that includes plenty of vegetables, fruits, low-fat dairy products, and lean protein.  Do not eat a lot of foods that are high in solid fats, added sugars, or sodium. Maintain a healthy weight Body mass index (BMI) is a measurement that can be used to identify possible weight problems. It estimates body fat based on height and weight. Your health care provider can help determine your BMI and help you achieve or maintain a healthy weight. Get regular exercise Get regular exercise. This is one of the most important things you can do for your health. Most adults should:  Exercise for at least 150 minutes each week. The exercise should increase your heart rate and make you sweat (moderate-intensity exercise).  Do strengthening exercises at least twice a week. This is in addition to the moderate-intensity exercise.  Spend less time sitting. Even light physical activity can be beneficial. Watch cholesterol and blood lipids Have your blood tested for lipids and cholesterol at 52 years of age, then have this test every 5 years. You may need to have your cholesterol levels checked more often if:  Your lipid or cholesterol levels are high.  You are older than 52 years of age.  You  are at high risk for heart disease. What should I know about cancer screening? Many types of cancers can be detected early and may often be prevented. Depending on your health history and family history, you may need to have cancer screening at various ages. This may include screening for:  Colorectal cancer.  Prostate cancer.  Skin cancer.  Lung cancer. What should I know about heart disease, diabetes, and high blood pressure? Blood pressure and heart disease  High blood pressure causes heart disease and increases the risk of stroke. This is more likely to develop in people who have high blood pressure readings, are of African descent, or are overweight.  Talk with your health care provider about your target blood pressure readings.  Have your blood pressure checked: ? Every 3-5 years if you are 42-73 years of age. ? Every year if you are 25 years old or older.  If you are between the ages of 33 and 36 and are a current or former smoker, ask your health care provider if you should have a one-time screening for abdominal aortic aneurysm (AAA). Diabetes Have regular diabetes screenings. This checks your fasting blood sugar level. Have the screening done:  Once every three years after age 62 if you are at a normal weight and have a low risk for diabetes.  More often and at a younger age if you are overweight or have a high risk for diabetes. What should I know about preventing infection? Hepatitis B If you have a higher risk for  hepatitis B, you should be screened for this virus. Talk with your health care provider to find out if you are at risk for hepatitis B infection. Hepatitis C Blood testing is recommended for:  Everyone born from [birth year] through 1963.  Anyone with known risk factors for hepatitis C. Sexually transmitted infections (STIs)  You should be screened each year for STIs, including gonorrhea and chlamydia, if: ? You are sexually active and are younger than 52 years  of age. ? You are older than 52 years of age and your health care provider tells you that you are at risk for this type of infection. ? Your sexual activity has changed since you were last screened, and you are at increased risk for chlamydia or gonorrhea. Ask your health care provider if you are at risk.  Ask your health care provider about whether you are at high risk for HIV. Your health care provider may recommend a prescription medicine to help prevent HIV infection. If you choose to take medicine to prevent HIV, you should first get tested for HIV. You should then be tested every 3 months for as long as you are taking the medicine. Follow these instructions at home: Lifestyle  Do not use any products that contain nicotine or tobacco, such as cigarettes, e-cigarettes, and chewing tobacco. If you need help quitting, ask your health care provider.  Do not use street drugs.  Do not share needles.  Ask your health care provider for help if you need support or information about quitting drugs. Alcohol use  Do not drink alcohol if your health care provider tells you not to drink.  If you drink alcohol: ? Limit how much you have to 0-2 drinks a day. ? Be aware of how much alcohol is in your drink. In the [place] one drink equals one 12 oz bottle of beer (355 mL), one 5 oz glass of wine (148 mL), or one 1 oz glass of hard liquor (44 mL). General instructions  Schedule regular health, dental, and eye exams.  Stay current with your vaccines.  Tell your health care provider if: ? You often feel depressed. ? You have ever been abused or do not feel safe at home. Summary  Adopting a healthy lifestyle and getting preventive care are important in promoting health and wellness.  Follow your health care provider's instructions about healthy diet, exercising, and getting tested or screened for diseases.  Follow your health care provider's instructions on monitoring your cholesterol and blood  pressure. This information is not intended to replace advice given to you by your health care provider. Make sure you discuss any questions you have with your health care provider. Document Revised: 07/09/2018 Document Reviewed: 07/09/2018 Elsevier Patient Education  2020 [employer].

## 2020-04-18 NOTE — Assessment & Plan Note (Signed)
rec restart thiamine daily.

## 2020-04-19 ENCOUNTER — Telehealth: Payer: Self-pay

## 2020-04-19 NOTE — Telephone Encounter (Signed)
Immunization list updated

## 2020-04-19 NOTE — Telephone Encounter (Signed)
[facility] Night - Client Nonclinical Telephone Record AccessNurse Client [facility] Night - Client Client Site [facility] [place] - Night Physician Eustaquio Boyden - MD Contact Type Call Who Is Calling Patient / Member / Family / Caregiver Caller Name Kirk Bradley Caller Phone Number [phone] Patient Name Kirk Bradley Patient DOB [birth year] Call Type Message Only Information Provided Reason for Call Request for General Office Information Initial Comment Caller states her husband was in this afternoon and she is calling to report his vaccine information. He received his second covid vaccine on 11/19/2019. Additional Comment Office hours were given/message was taken. Disp. Time Disposition Final User 04/18/2020 5:18:08 PM General Information Provided Yes Randa Evens, Tamika Call Closed By: Maryla Morrow Transaction Date/Time: 04/18/2020 5:14:51 PM (ET)

## 2020-05-16 ENCOUNTER — Other Ambulatory Visit: Payer: Self-pay | Admitting: *Deleted

## 2020-05-16 ENCOUNTER — Other Ambulatory Visit: Payer: Self-pay | Admitting: Acute Care

## 2020-05-16 ENCOUNTER — Other Ambulatory Visit: Payer: Self-pay

## 2020-05-16 ENCOUNTER — Encounter: Payer: Self-pay | Admitting: Acute Care

## 2020-05-16 DIAGNOSIS — F17210 Nicotine dependence, cigarettes, uncomplicated: Secondary | ICD-10-CM

## 2020-05-16 DIAGNOSIS — Z122 Encounter for screening for malignant neoplasm of respiratory organs: Secondary | ICD-10-CM

## 2020-05-16 NOTE — Patient Instructions (Signed)
Thank you for participating in the [place] [facility] Screening Program. It was our pleasure to meet you today. We will call you with the results of your scan within the next few days. Your scan will be assigned a Lung RADS category score by the physicians reading the scans.  This Lung RADS score determines follow up scanning.  See below for description of categories, and follow up screening recommendations. We will be in touch to schedule your follow up screening annually or based on recommendations of our providers. We will fax a copy of your scan results to your Primary Care Physician, or the physician who referred you to the program, to ensure they have the results. Please call the office if you have any questions or concerns regarding your scanning experience or results.  Our office number is [phone]. Please speak with Denise Phelps, RN. She is our Lung Cancer Screening RN. If she is unavailable when you call, please have the office staff send her a message. She will return your call at her earliest convenience. Remember, if your scan is normal, we will scan you annually as long as you continue to meet the criteria for the program. (Age 52-77, Current smoker or smoker who has quit within the last 15 years). If you are a smoker, remember, quitting is the single most powerful action that you can take to decrease your risk of lung cancer and other pulmonary, breathing related problems. We know quitting is hard, and we are here to help.  Please let us know if there is anything we can do to help you meet your goal of quitting. If you are a former smoker, congratulations. We are proud of you! Remain smoke free! Remember you can refer friends or family members through the number above.  We will screen them to make sure they meet criteria for the program. Thank you for helping us take better care of you by participating in Lung Screening.  Lung RADS Categories:  Lung RADS 1: no nodules  or definitely non-concerning nodules.  Recommendation is for a repeat annual scan in 12 months.  Lung RADS 2:  nodules that are non-concerning in appearance and behavior with a very low likelihood of becoming an active cancer. Recommendation is for a repeat annual scan in 12 months.  Lung RADS 3: nodules that are probably non-concerning , includes nodules with a low likelihood of becoming an active cancer.  Recommendation is for a 6-month repeat screening scan. Often noted after an upper respiratory illness. We will be in touch to make sure you have no questions, and to schedule your 6-month scan.  Lung RADS 4 A: nodules with concerning findings, recommendation is most often for a follow up scan in 3 months or additional testing based on our provider's assessment of the scan. We will be in touch to make sure you have no questions and to schedule the recommended 3 month follow up scan.  Lung RADS 4 B:  indicates findings that are concerning. We will be in touch with you to schedule additional diagnostic testing based on our provider's  assessment of the scan.   

## 2020-05-16 NOTE — Progress Notes (Signed)
Shared Decision-Making Visit for Lung Cancer Screening [phone] )  Lung Cancer Screening Criteria 16-33-years of age 52+ pack year smoking history No Recent History of coughing up blood   No Unexplained weight loss of > 15 pounds in the last 6 months. No Prior History Lung / other cancer  (Diagnosis within the last 5 years already requiring surveillance chest CT Scans). Pt is a current smoker, or former smoker who has quit within the last 15 years.  This patient meets the criterial noted above and is asymptomatic for any signs or symptoms of lung cancer.  The Shared Decision-Making Visit discussion included risks and benefits of screening, potential for follow up diagnostic testing for abnormal scans, potential for false positive tests, over diagnosis, and discussion about total radiation exposure. Patient stated willingness to undergo diagnostics and treatment as needed. Current smokers were counseled on smoking cessation as the single most powerful action they can take to decrease their risk of lung cancer, pulmonary disease, heart disease and stroke. They were given a resource card with information on receiving free nicotine replacement therapy , and information about free smoking cessation classes.  Pt understands that this scan is being paid for by a grant obtained by the [facility]. We discussed  that there is no guarantee of additional grant money from year to year as these grants are not guaranteed to programs on an annual basis.They have to be applied for and they are awarded based on county need, and utilization of the previous years funds..  I have spent 3 minutes counseling patient on smoking cessation this visit. Patient verbalizes understanding of their  choice to continue smoking and the negative health consequences including worsening of COPD, risk of lung cancer , stroke and heart disease..    No hemoptysis No unintentional weight loss Current smoker>> 51 pack  year smoking history  Ticket number 6 Verbalized understanding of the above  Smoking cessation information mailed with ticket for scan   Bevelyn Ngo, MSN, AGACNP-BC [facility] Pulmonary/Critical Care Medicine See Amion for personal pager PCCM on call pager [phone] 05/16/2020 5:40 PM

## 2020-05-26 ENCOUNTER — Ambulatory Visit
Admission: RE | Admit: 2020-05-26 | Discharge: 2020-05-26 | Disposition: A | Discharge status: Home / Self Care | Payer: No Typology Code available for payment source | Source: Ambulatory Visit | Attending: Acute Care | Admitting: Acute Care

## 2020-05-26 DIAGNOSIS — F17210 Nicotine dependence, cigarettes, uncomplicated: Secondary | ICD-10-CM

## 2020-05-28 ENCOUNTER — Encounter: Payer: Self-pay | Admitting: Family Medicine

## 2020-05-28 DIAGNOSIS — I2510 Atherosclerotic heart disease of native coronary artery without angina pectoris: Secondary | ICD-10-CM

## 2020-05-28 DIAGNOSIS — J410 Simple chronic bronchitis: Secondary | ICD-10-CM

## 2020-05-30 NOTE — Progress Notes (Signed)
Please call patient and let them  know their  low dose Ct was read as a Lung RADS 1, negative study: no nodules or definitely benign nodules. Radiology recommendation is for a repeat LDCT in 12 months. .Please let them  know we will order and schedule their  annual screening scan for 04/2021. Please let them  know there was notation of CAD on their  scan.  Please remind the patient  that this is a non-gated exam therefore degree or severity of disease  cannot be determined. Please have them  follow up with their PCP regarding potential risk factor modification, dietary therapy or pharmacologic therapy if clinically indicated. Pt.  is not  currently on statin therapy. Please place order for annual  screening scan for  04/2021 and fax results to PCP. Thanks so much.  Please have patient follow up with PCP about CAD findings. Thanks so much

## 2020-06-08 DIAGNOSIS — I2510 Atherosclerotic heart disease of native coronary artery without angina pectoris: Secondary | ICD-10-CM | POA: Insufficient documentation

## 2020-12-13 ENCOUNTER — Ambulatory Visit: Payer: Self-pay | Admitting: Family Medicine

## 2021-01-03 ENCOUNTER — Ambulatory Visit: Payer: Self-pay | Admitting: Family Medicine

## 2021-01-03 ENCOUNTER — Telehealth: Payer: Self-pay

## 2021-01-03 ENCOUNTER — Other Ambulatory Visit (INDEPENDENT_AMBULATORY_CARE_PROVIDER_SITE_OTHER): Payer: Self-pay

## 2021-01-03 DIAGNOSIS — R6883 Chills (without fever): Secondary | ICD-10-CM

## 2021-01-03 IMAGING — CT CT CHEST LUNG CANCER SCREENING LOW DOSE W/O CM
2 of 5 series · 15 of 40 positions shown, 18 images · non-contrast
Comparison: None

CLINICAL DATA: Lung cancer screening. Fifty-one pack-year history.
Current asymptomatic smoker.

EXAM:
CT CHEST WITHOUT CONTRAST LOW-DOSE FOR LUNG CANCER SCREENING
TECHNIQUE: Multidetector CT imaging of the chest was performed following the
standard protocol without IV contrast.

[Series 4: lung 1.00 br44 cor · coronal · 0.74mm/px · 3 of 258 slices shown]
[im 52/258  lung]
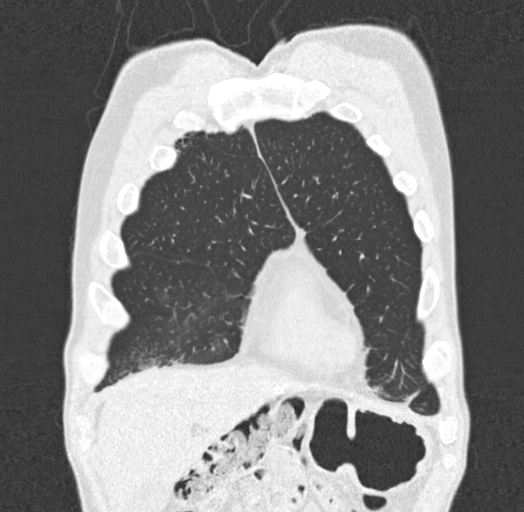
[im 103/258  lung]
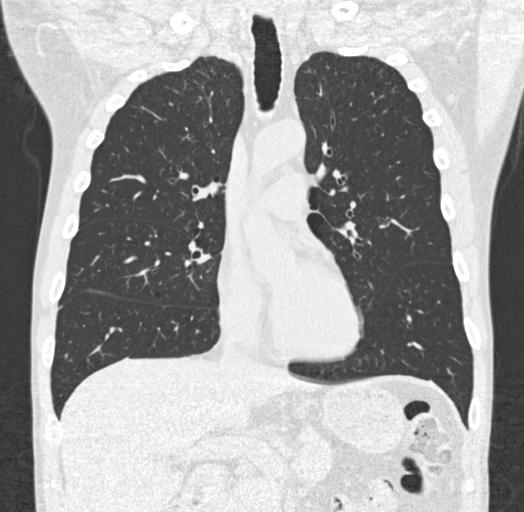
[im 155/258  lung]
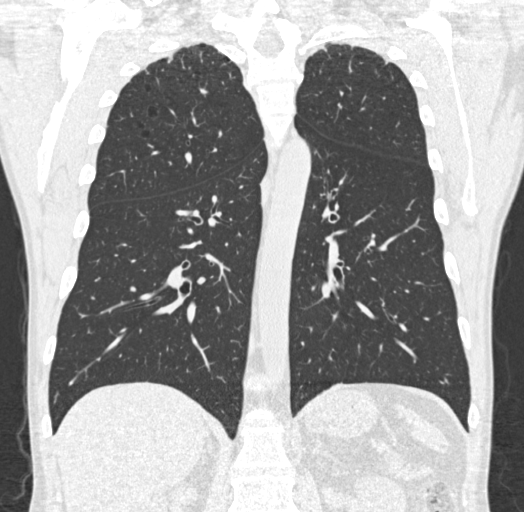

[Series 9: lung 1.00 br60 axial · axial · 0.76mm/px · z∈[-1143,-801]mm · 12 of 380 slices shown, 15 images]
[im 19/380  mediastinal]
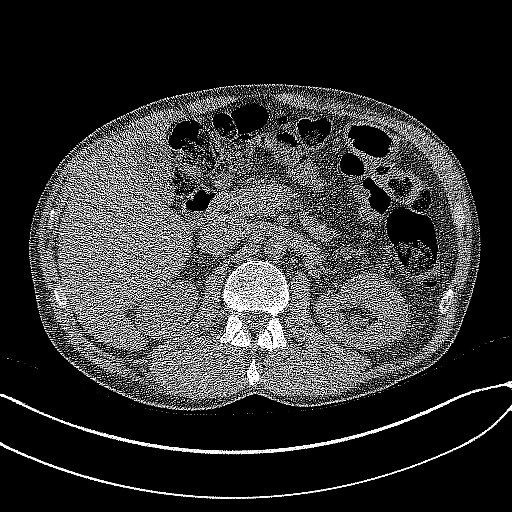
[im 19/380  lung]
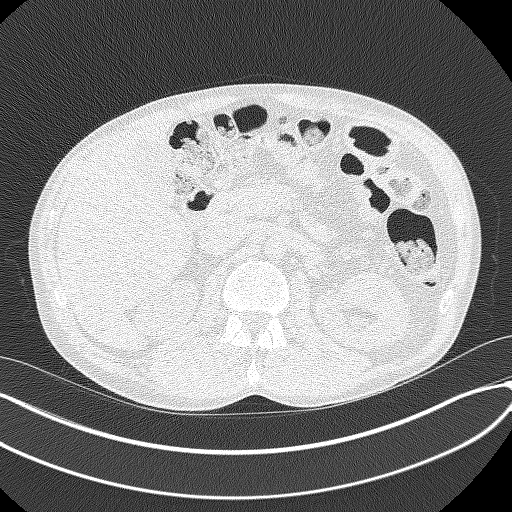
[im 55/380  lung]
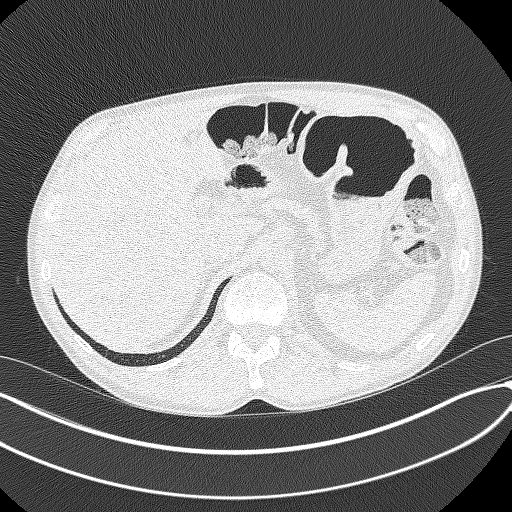
[im 91/380  lung]
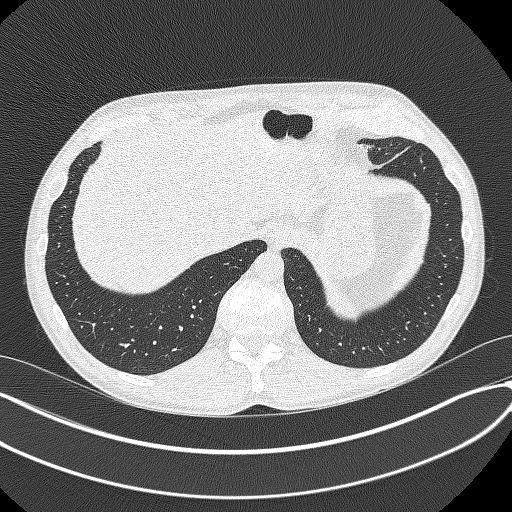
[im 109/380  lung]
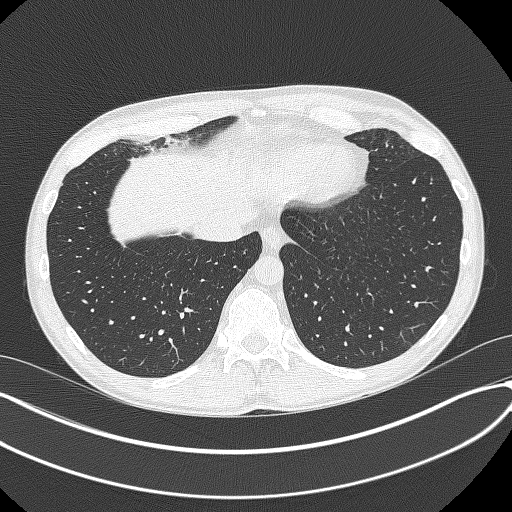
[im 145/380  mediastinal]
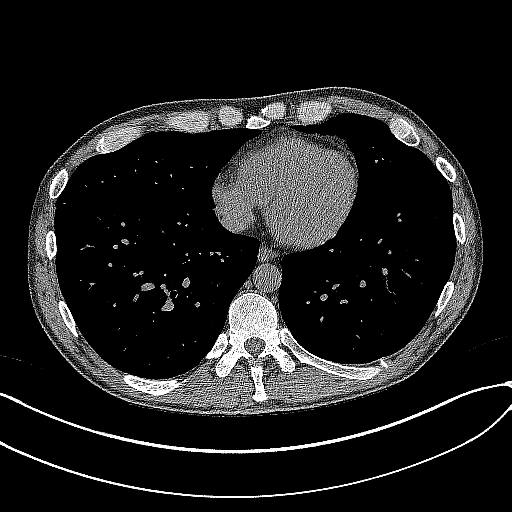
[im 145/380  lung]
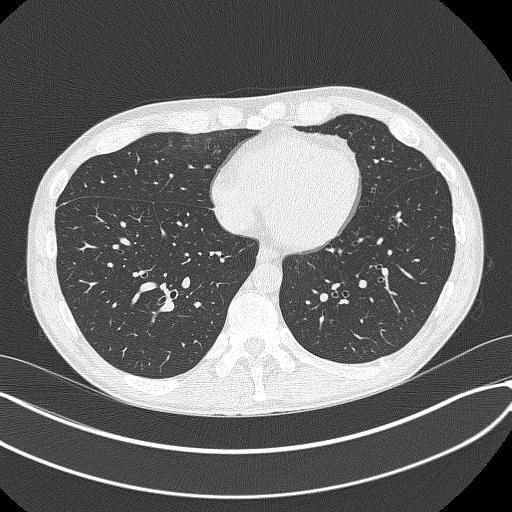
[im 181/380  lung]
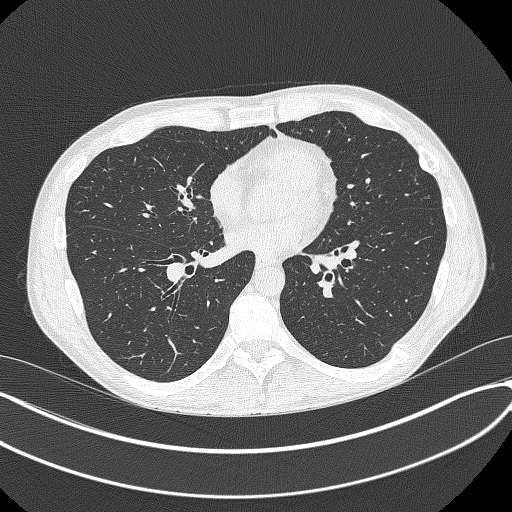
[im 199/380  lung]
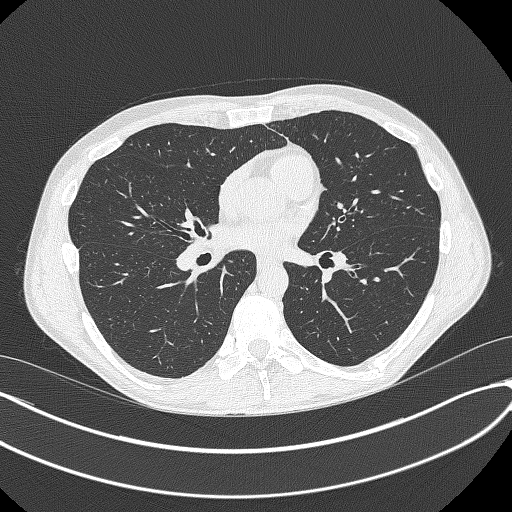
[im 235/380  lung]
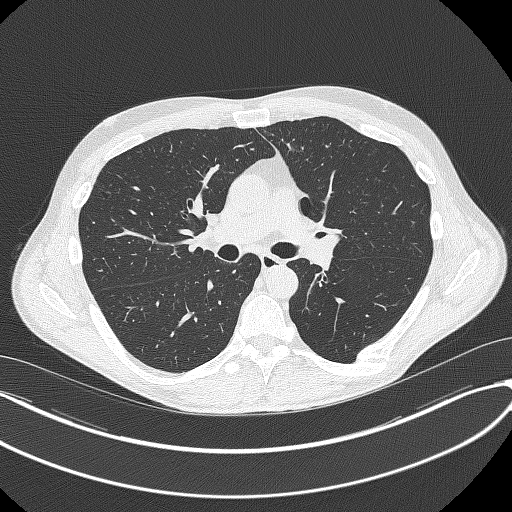
[im 271/380  mediastinal]
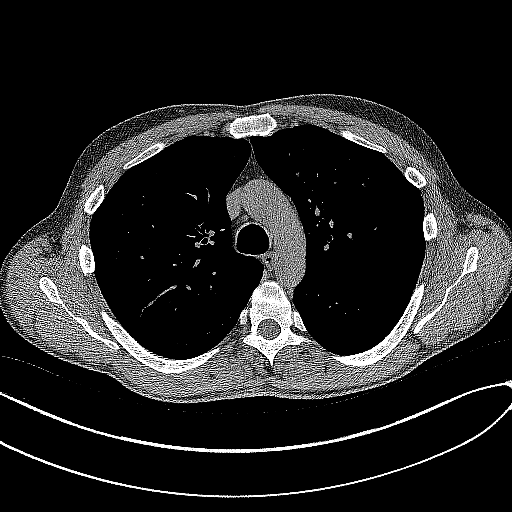
[im 271/380  lung]
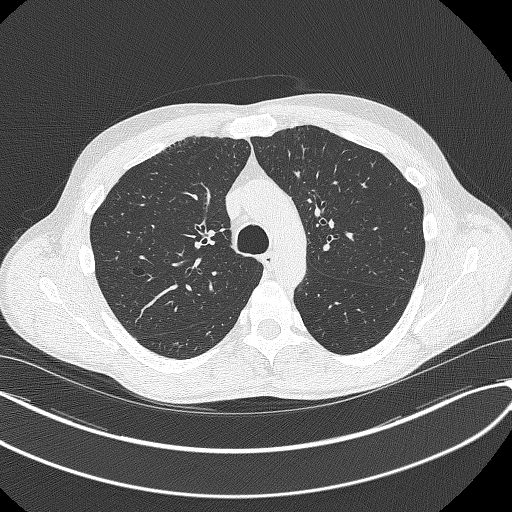
[im 289/380  lung]
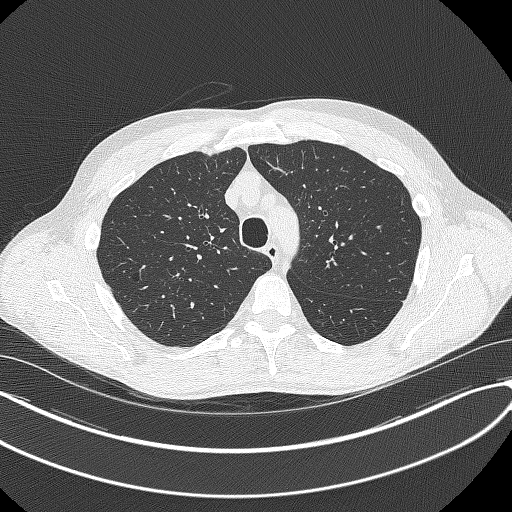
[im 325/380  lung]
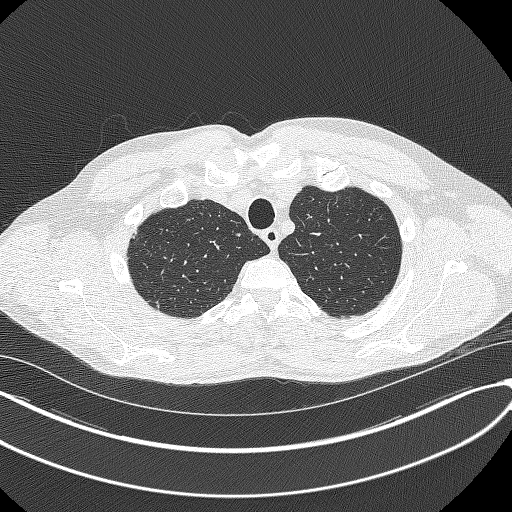
[im 361/380  lung]
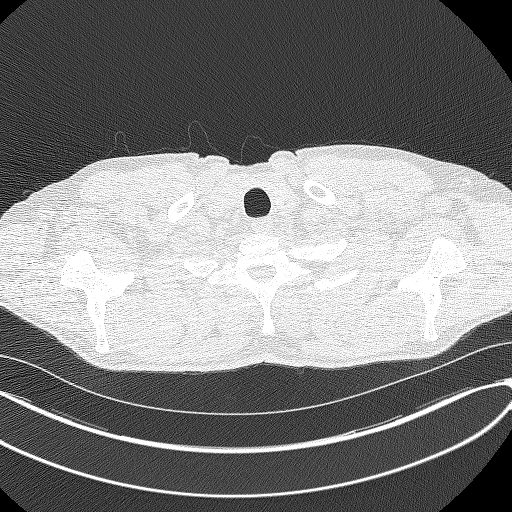

[15 of 40 positions shown; findings below may reference images not displayed]

FINDINGS: Cardiovascular: The heart size appears within normal limits. No
pericardial effusion. Lad coronary artery calcification.

Mediastinum/Nodes: No enlarged mediastinal, hilar, or axillary lymph
nodes. Thyroid gland, trachea, and esophagus demonstrate no
significant findings.

Lungs/Pleura: No pleural effusion, airspace consolidation, or
atelectasis identified. Mild centrilobular and paraseptal emphysema
identified. Calcified granuloma identified within the right upper
lobe. No suspicious lung nodules identified.

Upper Abdomen: No acute abnormality.

Musculoskeletal: No chest wall mass or suspicious bone lesions
identified. Multiple healed left posterior rib deformities.
IMPRESSION: 1. Lung-RADS 1, negative. Continue annual screening with low-dose
chest CT without contrast in 12 months.
2. Coronary artery calcifications.
3. Emphysema.

Emphysema ([id number]).

## 2021-01-03 NOTE — Telephone Encounter (Signed)
Pt came to office today for OV with PCP for back pain. Screening pt at front desk revealed pt has had chills and fatigue since yesterday afternoon. His wife has the same symptoms. Pt denied any other symptoms. Pt was afebrile but did take ibuprofen at 12:30pm today. Discussed with PCP, who recommended to RS pt for OV and have him placed on covid testing schedule today.   RS OV for pt for next week and placed pt on covid testing schedule. Advised pt if any symptoms worsened or he developed any new symptoms to contact the office. Advised pt of ER precautions. Pt verbalized understanding.

## 2021-01-03 NOTE — Telephone Encounter (Signed)
Noted. Thank you. Will await test result.

## 2021-01-04 LAB — SARS-COV-2, NAA 2 DAY TAT

## 2021-01-04 LAB — NOVEL CORONAVIRUS, NAA: SARS-CoV-2, NAA: NOT DETECTED

## 2021-01-05 ENCOUNTER — Telehealth: Payer: Self-pay

## 2021-01-05 NOTE — Telephone Encounter (Signed)
Lvm asking pt to call back.  Need to relay results and Dr. Timoteo Expose message.  Results/Dr. Timoteo Expose msg: Your COVID test returned reassuringly normal. Let us know if chills and fatigue persists.  We will see you next week for back pain.

## 2021-01-06 NOTE — Telephone Encounter (Addendum)
Lvm asking pt to call back.  Need to relay results and Dr. Timoteo Expose message.  Mailing a letter.   Results/Dr. Timoteo Expose msg: Your COVID test returned reassuringly normal. Let us know if chills and fatigue persists.  We will see you next week for back pain.

## 2021-01-11 ENCOUNTER — Ambulatory Visit: Payer: Self-pay | Admitting: Family Medicine

## 2021-02-14 ENCOUNTER — Ambulatory Visit: Payer: Self-pay | Admitting: Family Medicine

## 2021-08-12 ENCOUNTER — Other Ambulatory Visit: Payer: Self-pay | Admitting: Family Medicine

## 2021-08-12 DIAGNOSIS — Z125 Encounter for screening for malignant neoplasm of prostate: Secondary | ICD-10-CM

## 2021-08-12 DIAGNOSIS — F1090 Alcohol use, unspecified, uncomplicated: Secondary | ICD-10-CM

## 2021-08-12 DIAGNOSIS — Z13220 Encounter for screening for lipoid disorders: Secondary | ICD-10-CM

## 2021-08-12 DIAGNOSIS — E519 Thiamine deficiency, unspecified: Secondary | ICD-10-CM

## 2021-08-15 ENCOUNTER — Other Ambulatory Visit: Payer: Self-pay

## 2021-08-22 ENCOUNTER — Ambulatory Visit (INDEPENDENT_AMBULATORY_CARE_PROVIDER_SITE_OTHER): Payer: 59 | Admitting: Family Medicine

## 2021-08-22 ENCOUNTER — Other Ambulatory Visit: Payer: Self-pay

## 2021-08-22 ENCOUNTER — Encounter: Payer: Self-pay | Admitting: Family Medicine

## 2021-08-22 VITALS — BP 136/84 | HR 78 | Temp 97.9°F | Ht 70.0 in | Wt 187.3 lb

## 2021-08-22 DIAGNOSIS — Z1211 Encounter for screening for malignant neoplasm of colon: Secondary | ICD-10-CM

## 2021-08-22 DIAGNOSIS — Z13220 Encounter for screening for lipoid disorders: Secondary | ICD-10-CM

## 2021-08-22 DIAGNOSIS — M159 Polyosteoarthritis, unspecified: Secondary | ICD-10-CM

## 2021-08-22 DIAGNOSIS — Z125 Encounter for screening for malignant neoplasm of prostate: Secondary | ICD-10-CM | POA: Diagnosis not present

## 2021-08-22 DIAGNOSIS — I2510 Atherosclerotic heart disease of native coronary artery without angina pectoris: Secondary | ICD-10-CM

## 2021-08-22 DIAGNOSIS — K219 Gastro-esophageal reflux disease without esophagitis: Secondary | ICD-10-CM

## 2021-08-22 DIAGNOSIS — E519 Thiamine deficiency, unspecified: Secondary | ICD-10-CM

## 2021-08-22 DIAGNOSIS — F1090 Alcohol use, unspecified, uncomplicated: Secondary | ICD-10-CM

## 2021-08-22 DIAGNOSIS — Z23 Encounter for immunization: Secondary | ICD-10-CM

## 2021-08-22 DIAGNOSIS — Z0000 Encounter for general adult medical examination without abnormal findings: Secondary | ICD-10-CM | POA: Diagnosis not present

## 2021-08-22 DIAGNOSIS — J432 Centrilobular emphysema: Secondary | ICD-10-CM

## 2021-08-22 DIAGNOSIS — F17200 Nicotine dependence, unspecified, uncomplicated: Secondary | ICD-10-CM

## 2021-08-22 LAB — CBC WITH DIFFERENTIAL/PLATELET
Basophils Absolute: 0.1 10*3/uL (ref 0.0–0.1)
Basophils Relative: 1.1 % (ref 0.0–3.0)
Eosinophils Absolute: 0.1 10*3/uL (ref 0.0–0.7)
Eosinophils Relative: 1.4 % (ref 0.0–5.0)
HCT: 37.8 % — ABNORMAL LOW (ref 39.0–52.0)
Hemoglobin: 12.5 g/dL — ABNORMAL LOW (ref 13.0–17.0)
Lymphocytes Relative: 29.8 % (ref 12.0–46.0)
Lymphs Abs: 1.9 10*3/uL (ref 0.7–4.0)
MCHC: 33.2 g/dL (ref 30.0–36.0)
MCV: 104.5 fl — ABNORMAL HIGH (ref 78.0–100.0)
Monocytes Absolute: 0.6 10*3/uL (ref 0.1–1.0)
Monocytes Relative: 9.3 % (ref 3.0–12.0)
Neutro Abs: 3.7 10*3/uL (ref 1.4–7.7)
Neutrophils Relative %: 58.4 % (ref 43.0–77.0)
Platelets: 213 10*3/uL (ref 150.0–400.0)
RBC: 3.61 Mil/uL — ABNORMAL LOW (ref 4.22–5.81)
RDW: 15.1 % (ref 11.5–15.5)
WBC: 6.3 10*3/uL (ref 4.0–10.5)

## 2021-08-22 LAB — COMPREHENSIVE METABOLIC PANEL
ALT: 47 U/L (ref 0–53)
AST: 61 U/L — ABNORMAL HIGH (ref 0–37)
Albumin: 4.6 g/dL (ref 3.5–5.2)
Alkaline Phosphatase: 51 U/L (ref 39–117)
BUN: 11 mg/dL (ref 6–23)
CO2: 25 mEq/L (ref 19–32)
Calcium: 9.5 mg/dL (ref 8.4–10.5)
Chloride: 103 mEq/L (ref 96–112)
Creatinine, Ser: 0.93 mg/dL (ref 0.40–1.50)
GFR: 93.69 mL/min (ref >60.00)
Glucose, Bld: 89 mg/dL (ref 70–99)
Potassium: 4 mEq/L (ref 3.5–5.1)
Sodium: 137 mEq/L (ref 135–145)
Total Bilirubin: 1.6 mg/dL — ABNORMAL HIGH (ref 0.2–1.2)
Total Protein: 7.2 g/dL (ref 6.0–8.3)

## 2021-08-22 LAB — LIPID PANEL
Cholesterol: 189 mg/dL (ref 0–200)
HDL: 75 mg/dL (ref >39.00)
LDL Cholesterol: 96 mg/dL (ref 0–99)
NonHDL: 113.71
Total CHOL/HDL Ratio: 3
Triglycerides: 88 mg/dL (ref 0.0–149.0)
VLDL: 17.6 mg/dL (ref 0.0–40.0)

## 2021-08-22 LAB — PSA: PSA: 1.29 ng/mL (ref 0.10–4.00)

## 2021-08-22 LAB — FOLATE: Folate: >24.2 ng/mL (ref >5.9)

## 2021-08-22 LAB — VITAMIN B12: Vitamin B-12: >1504 pg/mL — ABNORMAL HIGH (ref 211–911)

## 2021-08-22 MED ORDER — GABAPENTIN 300 MG PO CAPS: 300.0000 mg | ORAL_CAPSULE | Freq: Every day | ORAL | 3 refills | Status: DC

## 2021-08-22 MED ORDER — CYCLOBENZAPRINE HCL 10 MG PO TABS: 5.0000 mg | ORAL_TABLET | Freq: Two times a day (BID) | ORAL | 3 refills | Status: DC | PRN

## 2021-08-22 NOTE — Assessment & Plan Note (Addendum)
Ongoing 1-2 PPD smoker.  Encouraged ongoing cessation efforts, reviewed medication assistance options including chantix. He has previously tried nicorette CQ gum and wellbutrin.  Will re refer back to lung cancer screening program.

## 2021-08-22 NOTE — Assessment & Plan Note (Signed)
Update b12 levels.  

## 2021-08-22 NOTE — Progress Notes (Signed)
Patient ID: Kirk Bradley, male    DOB: 1966-04-23, 56 y.o.   MRN: [id number]  This visit was conducted in person.  BP 136/84    Pulse 78    Temp 97.9 F (36.6 C) (Temporal)    Ht '5\' 10"'  (1.778 m)    Wt 187 lb 5 oz (85 kg)    SpO2 94%    BMI 26.88 kg/m    CC: CPE Subjective:   HPI: Kirk Bradley is a 56 y.o. male presenting on 08/22/2021 for Annual Exam   He takes B12 and b1 vitamins daily.  Requests flexeril refilled PRN due to physical labor.  Takes gabapentin 1 in the morning for hand pains.  Drinks 5 hour energy in the mornings and another at lunch, during the week.   Preventative: Colon cancer screening - has not completed. Will send home with iFOB.  Prostate cancer - discussed, will check with PSA  Lung cancer screening - eligible  Flu shot - declines COVID vaccine - [place] 10/29/2019 x2, booster 06/2020 Td - 06/2010  shingrix - discussed.  Seat belt use discussed  Sunscreen use discussed. No changing moles on skin.  Smoker - 1-2 ppd, started age 64yo  Alcohol - up to 18 pack in 1 sitting - has slowed down to a few mixed drinks and a few beers at a time.  Rec drugs - [facility] - cutting down Dentist yearly  Eye exam - due  Lives with wife and 8 dogs Occupation: works in [employer]: [employer] Activity: very active at work Diet: good water, fruits/vegetables daily     Relevant past medical, surgical, family and social history reviewed and updated as indicated. Interim medical history since our last visit reviewed. Allergies and medications reviewed and updated. Outpatient Medications Prior to Visit  Medication Sig Dispense Refill   Cyanocobalamin (B-12) 1000 MCG SUBL Place 1 tablet under the tongue daily.     omeprazole (PRILOSEC) 20 MG capsule Take 1 capsule (20 mg total) by mouth daily.     Thiamine HCl (B-1) 100 MG TABS Take 1 tablet (100 mg total) by mouth daily. 100 tablet 3   cyclobenzaprine (FLEXERIL) 10 MG tablet Take 0.5-1 tablets (5-10 mg total) by  mouth 2 (two) times daily as needed for muscle spasms. 30 tablet 3   gabapentin (NEURONTIN) 300 MG capsule TAKE 1 CAPSULE BY MOUTH TWICE A DAY 180 capsule 3   No facility-administered medications prior to visit.     Per HPI unless specifically indicated in ROS section below Review of Systems  Constitutional:  Negative for activity change, appetite change, chills, fatigue, fever and unexpected weight change.  HENT:  Negative for hearing loss.   Eyes:  Negative for visual disturbance.  Respiratory:  Negative for cough, chest tightness, shortness of breath and wheezing.   Cardiovascular:  Negative for chest pain, palpitations and leg swelling.  Gastrointestinal:  Negative for abdominal distention, abdominal pain, blood in stool, constipation, diarrhea, nausea and vomiting.  Genitourinary:  Negative for difficulty urinating and hematuria.  Musculoskeletal:  Negative for arthralgias, myalgias and neck pain.  Skin:  Negative for rash.  Neurological:  Negative for dizziness, seizures, syncope and headaches.  Hematological:  Negative for adenopathy. Does not bruise/bleed easily.  Psychiatric/Behavioral:  Negative for dysphoric mood. The patient is not nervous/anxious.    Objective:  BP 136/84    Pulse 78    Temp 97.9 F (36.6 C) (Temporal)    Ht '5\' 10"'  (1.778 m)    Wt  187 lb 5 oz (85 kg)    SpO2 94%    BMI 26.88 kg/m   Wt Readings from Last 3 Encounters:  08/22/21 187 lb 5 oz (85 kg)  04/18/20 171 lb 5 oz (77.7 kg)  05/30/18 168 lb (76.2 kg)      Physical Exam Vitals and nursing note reviewed.  Constitutional:      General: He is not in acute distress.    Appearance: Normal appearance. He is well-developed. He is not ill-appearing.  HENT:     Head: Normocephalic and atraumatic.     Right Ear: Hearing, tympanic membrane, ear canal and external ear normal.     Left Ear: Hearing, tympanic membrane, ear canal and external ear normal.  Eyes:     General: No scleral icterus.     Extraocular Movements: Extraocular movements intact.     Conjunctiva/sclera: Conjunctivae normal.     Pupils: Pupils are equal, round, and reactive to light.  Neck:     Thyroid: No thyroid mass or thyromegaly.  Cardiovascular:     Rate and Rhythm: Normal rate and regular rhythm.     Pulses: Normal pulses.          Radial pulses are 2+ on the right side and 2+ on the left side.     Heart sounds: Normal heart sounds. No murmur heard. Pulmonary:     Effort: Pulmonary effort is normal. No respiratory distress.     Breath sounds: Normal breath sounds. No wheezing, rhonchi or rales.  Abdominal:     General: Bowel sounds are normal. There is no distension.     Palpations: Abdomen is soft. There is no mass.     Tenderness: There is no abdominal tenderness. There is no guarding or rebound.     Hernia: No hernia is present.  Musculoskeletal:        General: Normal range of motion.     Cervical back: Normal range of motion and neck supple.     Right lower leg: No edema.     Left lower leg: No edema.  Lymphadenopathy:     Cervical: No cervical adenopathy.  Skin:    General: Skin is warm and dry.     Findings: No rash.  Neurological:     General: No focal deficit present.     Mental Status: He is alert and oriented to person, place, and time.  Psychiatric:        Mood and Affect: Mood normal.        Behavior: Behavior normal.        Thought Content: Thought content normal.        Judgment: Judgment normal.      Results for orders placed or performed in visit on 01/03/21  Novel Coronavirus, NAA ([facility])   Specimen: Nasopharyngeal(NP) swabs in vial transport medium   Nasopharynge  Result Value Ref Range   SARS-CoV-2, NAA Not Detected Not Detected  SARS-COV-2, NAA 2 DAY TAT   Nasopharynge  Result Value Ref Range   SARS-CoV-2, NAA 2 DAY TAT Performed    Depression screen [facility] 2/9 08/22/2021 04/18/2020 11/28/2017 09/27/2017 01/21/2015  Decreased Interest 1 0 2 3 0  Down, Depressed, Hopeless  0 0 0 1 0  PHQ - 2 Score 1 0 2 4 0  Altered sleeping 2 - 3 3 -  Tired, decreased energy 3 - 3 3 -  Change in appetite 1 - 2 3 -  Feeling bad or failure about yourself  0 -  0 1 -  Trouble concentrating 0 - 1 0 -  Moving slowly or fidgety/restless 0 - 0 0 -  Suicidal thoughts 0 - 0 0 -  PHQ-9 Score 7 - 11 14 -    GAD 7 : Generalized Anxiety Score 08/22/2021 11/28/2017  Nervous, Anxious, on Edge 0 1  Control/stop worrying 1 3  Worry too much - different things 1 2  Trouble relaxing 1 1  Restless 0 1  Easily annoyed or irritable 1 3  Afraid - awful might happen 1 1  Total GAD 7 Score 5 12    EKG - NSR rate 75, normal axis, intervals, no hypertrophy or acute ST/T changes, T wave inversions lead III. No old to compare.   Assessment & Plan:  This visit occurred during the SARS-CoV-2 public health emergency.  Safety protocols were in place, including screening questions prior to the visit, additional usage of staff PPE, and extensive cleaning of exam room while observing appropriate contact time as indicated for disinfecting solutions.   Problem List Items Addressed This Visit     Health maintenance examination - Primary (Chronic)    Preventative protocols reviewed and updated unless pt declined. Discussed healthy diet and lifestyle.       Osteoarthritis    Continue gabapentin 3574m in am.       Relevant Medications   cyclobenzaprine (FLEXERIL) 10 MG tablet   GERD (gastroesophageal reflux disease)    Stable period on OTC omeprazole 252mdaily.       Habitual alcohol use    Encouraged continued decreased alcohol intake for overall health. Update vitamin levels today.       Smoker    Ongoing 1-2 PPD smoker.  Encouraged ongoing cessation efforts, reviewed medication assistance options including chantix. He has previously tried nicorette CQ gum and wellbutrin.  Will re refer back to lung cancer screening program.       Relevant Orders   Ambulatory Referral for Lung Cancer Scre    [place] of lung [place]   Incidental finding on lung cancer screening CT.  Pt denies current symptoms. Encouraged smoking cessation.       Vitamin B1 deficiency    Update b12 levels.       Coronary artery calcification seen on CAT scan    Incidental finding on lung cancer screening CT.  Encouraged smoking cessation as best option to decrease cardiovascular risk.  Update FLP.  The ASCVD Risk score (Arnett DK, et al., 2019) failed to calculate for the following reasons:   Cannot find a previous HDL lab   Cannot find a previous total cholesterol lab       Relevant Orders   EKG 12-Lead (Completed)   Other Visit Diagnoses     Need for influenza vaccination       Relevant Orders   Flu Vaccine QUAD 74m11mo (Fluarix, Fluzone & Alfiuria Quad PF) (Completed)   Special screening for malignant neoplasm of prostate       Special screening for malignant neoplasms, colon       Relevant Orders   Fecal occult blood, imunochemical   Lipid screening            Meds ordered this encounter  Medications   cyclobenzaprine (FLEXERIL) 10 MG tablet    Sig: Take 0.5-1 tablets (5-10 mg total) by mouth 2 (two) times daily as needed for muscle spasms.    Dispense:  30 tablet    Refill:  3   gabapentin (NEURONTIN) 300 MG  capsule    Sig: Take 1 capsule (300 mg total) by mouth daily.    Dispense:  90 capsule    Refill:  3   Orders Placed This Encounter  Procedures   Fecal occult blood, imunochemical    Standing Status:   Future    Standing Expiration Date:   08/22/2022   Flu Vaccine QUAD 38moIM (Fluarix, Fluzone & Alfiuria Quad PF)   Ambulatory Referral for Lung Cancer Scre    Referral Priority:   Routine    Referral Type:   Consultation    Referral Reason:   Specialty Services Required    Number of Visits Requested:   1   EKG 12-Lead     Patient instructions: Flu shot today  EKG today  Labs today  Consider tetanus and shingles shots in the future.  Pass by lab to pick up stool  kit.  We will refer you back for lung cancer screening program. Good to see you today. Return as needed or in 1 year for next physical.  Let [place] if interested in discussing assistance to help quit smoking.   Follow up plan: Return in about 1 year (around 08/22/2022) for annual exam, prior fasting for blood work.  JRia Bush MD

## 2021-08-22 NOTE — Assessment & Plan Note (Signed)
Incidental finding on lung cancer screening CT.  Encouraged smoking cessation as best option to decrease cardiovascular risk.  Update FLP.  The ASCVD Risk score (Arnett DK, et al., 2019) failed to calculate for the following reasons:   Cannot find a previous HDL lab   Cannot find a previous total cholesterol lab

## 2021-08-22 NOTE — Assessment & Plan Note (Signed)
Incidental finding on lung cancer screening CT.  Pt denies current symptoms. Encouraged smoking cessation.

## 2021-08-22 NOTE — Patient Instructions (Addendum)
Flu shot today  EKG today  Labs today  Consider tetanus and shingles shots in the future.  Pass by lab to pick up stool kit.  We will refer you back for lung cancer screening program. Good to see you today. Return as needed or in 1 year for next physical.  Let us know if interested in discussing assistance to help quit smoking.   Health Maintenance, Male Adopting a healthy lifestyle and getting preventive care are important in promoting health and wellness. Ask your health care provider about: The right schedule for you to have regular tests and exams. Things you can do on your own to prevent diseases and keep yourself healthy. What should I know about diet, weight, and exercise? Eat a healthy diet  Eat a diet that includes plenty of vegetables, fruits, low-fat dairy products, and lean protein. Do not eat a lot of foods that are high in solid fats, added sugars, or sodium. Maintain a healthy weight Body mass index (BMI) is a measurement that can be used to identify possible weight problems. It estimates body fat based on height and weight. Your health care provider can help determine your BMI and help you achieve or maintain a healthy weight. Get regular exercise Get regular exercise. This is one of the most important things you can do for your health. Most adults should: Exercise for at least 150 minutes each week. The exercise should increase your heart rate and make you sweat (moderate-intensity exercise). Do strengthening exercises at least twice a week. This is in addition to the moderate-intensity exercise. Spend less time sitting. Even light physical activity can be beneficial. Watch cholesterol and blood lipids Have your blood tested for lipids and cholesterol at 54 years of age, then have this test every 5 years. You may need to have your cholesterol levels checked more often if: Your lipid or cholesterol levels are high. You are older than 54 years of age. You are at high  risk for heart disease. What should I know about cancer screening? Many types of cancers can be detected early and may often be prevented. Depending on your health history and family history, you may need to have cancer screening at various ages. This may include screening for: Colorectal cancer. Prostate cancer. Skin cancer. Lung cancer. What should I know about heart disease, diabetes, and high blood pressure? Blood pressure and heart disease High blood pressure causes heart disease and increases the risk of stroke. This is more likely to develop in people who have high blood pressure readings or are overweight. Talk with your health care provider about your target blood pressure readings. Have your blood pressure checked: Every 3-5 years if you are 71-22 years of age. Every year if you are 25 years old or older. If you are between the ages of 33 and 57 and are a current or former smoker, ask your health care provider if you should have a one-time screening for abdominal aortic aneurysm (AAA). Diabetes Have regular diabetes screenings. This checks your fasting blood sugar level. Have the screening done: Once every three years after age 59 if you are at a normal weight and have a low risk for diabetes. More often and at a younger age if you are overweight or have a high risk for diabetes. What should I know about preventing infection? Hepatitis B If you have a higher risk for hepatitis B, you should be screened for this virus. Talk with your health care provider to find out if  you are at risk for hepatitis B infection. Hepatitis C Blood testing is recommended for: Everyone born from [birth year] through 1963. Anyone with known risk factors for hepatitis C. Sexually transmitted infections (STIs) You should be screened each year for STIs, including gonorrhea and chlamydia, if: You are sexually active and are younger than 54 years of age. You are older than 54 years of age and your health care  provider tells you that you are at risk for this type of infection. Your sexual activity has changed since you were last screened, and you are at increased risk for chlamydia or gonorrhea. Ask your health care provider if you are at risk. Ask your health care provider about whether you are at high risk for HIV. Your health care provider may recommend a prescription medicine to help prevent HIV infection. If you choose to take medicine to prevent HIV, you should first get tested for HIV. You should then be tested every 3 months for as long as you are taking the medicine. Follow these instructions at home: Alcohol use Do not drink alcohol if your health care provider tells you not to drink. If you drink alcohol: Limit how much you have to 0-2 drinks a day. Know how much alcohol is in your drink. In the [place] one drink equals one 12 oz bottle of beer (355 mL), one 5 oz glass of wine (148 mL), or one 1 oz glass of hard liquor (44 mL). Lifestyle Do not use any products that contain nicotine or tobacco. These products include cigarettes, chewing tobacco, and vaping devices, such as e-cigarettes. If you need help quitting, ask your health care provider. Do not use street drugs. Do not share needles. Ask your health care provider for help if you need support or information about quitting drugs. General instructions Schedule regular health, dental, and eye exams. Stay current with your vaccines. Tell your health care provider if: You often feel depressed. You have ever been abused or do not feel safe at home. Summary Adopting a healthy lifestyle and getting preventive care are important in promoting health and wellness. Follow your health care provider's instructions about healthy diet, exercising, and getting tested or screened for diseases. Follow your health care provider's instructions on monitoring your cholesterol and blood pressure. This information is not intended to replace advice given to  you by your health care provider. Make sure you discuss any questions you have with your health care provider. Document Revised: 12/05/2020 Document Reviewed: 12/05/2020 Elsevier Patient Education  [place].

## 2021-08-22 NOTE — Assessment & Plan Note (Signed)
Stable period on OTC omeprazole 20mg daily.  

## 2021-08-22 NOTE — Assessment & Plan Note (Addendum)
Encouraged continued decreased alcohol intake for overall health. Update vitamin levels today.

## 2021-08-22 NOTE — Assessment & Plan Note (Addendum)
Continue gabapentin 300mg  in am.

## 2021-08-22 NOTE — Assessment & Plan Note (Signed)
Preventative protocols reviewed and updated unless pt declined. Discussed healthy diet and lifestyle.  

## 2021-08-24 ENCOUNTER — Other Ambulatory Visit: Payer: Self-pay | Admitting: Family Medicine

## 2021-08-24 MED ORDER — B-12 1000 MCG SL SUBL: 1.0000 | SUBLINGUAL_TABLET | SUBLINGUAL | Status: DC

## 2021-08-26 LAB — VITAMIN B1: Vitamin B1 (Thiamine): 48 nmol/L — ABNORMAL HIGH (ref 8–30)

## 2021-08-28 ENCOUNTER — Other Ambulatory Visit: Payer: Self-pay | Admitting: Family Medicine

## 2021-08-28 MED ORDER — B-1 100 MG PO TABS: 1.0000 | ORAL_TABLET | ORAL | 3 refills | Status: DC

## 2021-08-31 MED ORDER — OMEPRAZOLE 20 MG PO CPDR: 20.0000 mg | DELAYED_RELEASE_CAPSULE | Freq: Every day | ORAL | 3 refills | Status: DC

## 2021-08-31 NOTE — Telephone Encounter (Signed)
Sent rx for omeprazole.

## 2021-10-16 ENCOUNTER — Other Ambulatory Visit: Payer: Self-pay

## 2021-10-16 DIAGNOSIS — Z87891 Personal history of nicotine dependence: Secondary | ICD-10-CM

## 2021-10-16 DIAGNOSIS — F17210 Nicotine dependence, cigarettes, uncomplicated: Secondary | ICD-10-CM

## 2021-11-02 ENCOUNTER — Ambulatory Visit (INDEPENDENT_AMBULATORY_CARE_PROVIDER_SITE_OTHER): Payer: 59 | Admitting: Nurse Practitioner

## 2021-11-02 ENCOUNTER — Ambulatory Visit (INDEPENDENT_AMBULATORY_CARE_PROVIDER_SITE_OTHER)
Admission: RE | Admit: 2021-11-02 | Discharge: 2021-11-02 | Disposition: A | Discharge status: Home / Self Care | Payer: 59 | Source: Ambulatory Visit | Attending: Nurse Practitioner | Admitting: Nurse Practitioner

## 2021-11-02 VITALS — BP 122/84 | HR 91 | Temp 97.7°F | Resp 16 | Ht 70.0 in | Wt 182.2 lb

## 2021-11-02 DIAGNOSIS — H00012 Hordeolum externum right lower eyelid: Secondary | ICD-10-CM | POA: Diagnosis not present

## 2021-11-02 DIAGNOSIS — S61011A Laceration without foreign body of right thumb without damage to nail, initial encounter: Secondary | ICD-10-CM | POA: Diagnosis not present

## 2021-11-02 DIAGNOSIS — Z23 Encounter for immunization: Secondary | ICD-10-CM

## 2021-11-02 MED ORDER — ERYTHROMYCIN 5 MG/GM OP OINT: 1.0000 "application " | TOPICAL_OINTMENT | Freq: Three times a day (TID) | OPHTHALMIC | 0 refills | Status: DC

## 2021-11-02 NOTE — Assessment & Plan Note (Signed)
Patient is without relief.  Seems to be getting larger.  Will send in erythromycin eye ointment 3 times daily for 7 days.  Patient does not wear contacts but does have over-the-counter readers. ?

## 2021-11-02 NOTE — Patient Instructions (Addendum)
Nice to see you today ?You need to immobilize the finger to allow it to heal ?Follow up with Dr Reece Agar. In a week for a progress check ? ?Clean the wound with soap and water gently. Change the dressing daily ?

## 2021-11-02 NOTE — Assessment & Plan Note (Signed)
Laceration to the right posterior thumb.  Is been over a week.  Did update patient's tetanus vaccine in office.  Did instruct him to clean it daily with gentle soap and water.  Told him it would not heal if he does not immobilize thumb.  We did place a bulky dressing to thumb inclusive of nonadherent and Kerlix wrap.  We will have him follow-up in 1 week to see if her making any improvement ?

## 2021-11-02 NOTE — Progress Notes (Signed)
? ?Acute Office Visit ? ?Subjective:  ? ? Patient ID: Kirk Bradley, male    DOB: Feb 26, 1966, 56 y.o.   MRN: [id number] ? ?Chief Complaint  ?Patient presents with  ? thumb cut  ?  Right thumb cut x 1 week ago. Cut it/sliced it on a nail patient thinks. Has some pain and numbness around the knuckle of the thumb  ? Eye Problem  ?  Right eye, has a cyst? On the inside of the lower eyelid. X 1 month  ? ? ? ?Patient is in today for lacertion ? ?States that a week ago cut on a clean nail. States that he has trouble getting it to heal. He has been using an aloe lotion that he has tried. States that he is having pain and numbness.  ? ?Left eye started approx 3-4 weeks ago. States that it is described as a discomfort. He has tried warm compresses without releif and no discharge or pain. No visual disturbances.  A mild amount of swelling to right lower lid medially ? ?Past Medical History:  ?Diagnosis Date  ? Alcohol abuse   ? GERD (gastroesophageal reflux disease)   ? Osteoarthritis   ? Smoker   ? ? ?Past Surgical History:  ?Procedure Laterality Date  ? NO PAST SURGERIES    ? ? ?Family History  ?Problem Relation Age of Onset  ? Rheum arthritis Mother   ? Pulmonary embolism Mother   ? Alcoholism Father   ? Alcoholism Maternal Grandfather   ? Alcoholism Maternal Aunt   ? Alcoholism Maternal Uncle   ? Cancer Maternal Uncle   ?     unsure, agent orange exposure in [place]  ? CAD Neg Hx   ? Diabetes Neg Hx   ? ? ?Social History  ? ?Socioeconomic History  ? Marital status: Married  ?  Spouse name: Not on file  ? Number of children: Not on file  ? Years of education: Not on file  ? Highest education level: Not on file  ?Occupational History  ? Not on file  ?Tobacco Use  ? Smoking status: Every Day  ?  Packs/day: 1.50  ?  Years: 34.00  ?  Pack years: 51.00  ?  Types: Cigarettes  ?  Start date: 07/30/1986  ? Smokeless tobacco: Never  ? Tobacco comments:  ?  1-2 ppd  ?Substance and Sexual Activity  ? Alcohol use: Yes  ?   Alcohol/week: 0.0 standard drinks  ?  Comment: 12-18 pack beer/day  ? Drug use: Yes  ?  Comment: MJ - occasional  ? Sexual activity: Not on file  ?Other Topics Concern  ? Not on file  ?Social History Narrative  ? Lives with wife and 8 dogs  ? Occupation: works in Holiday representativeconstruction  ? Edu: GED  ? Activity: very active at work  ? Diet: good water, fruits/vegetables daily  ? ?Social Determinants of Health  ? ?Financial Resource Strain: Not on file  ?Food Insecurity: Not on file  ?Transportation Needs: Not on file  ?Physical Activity: Not on file  ?Stress: Not on file  ?Social Connections: Not on file  ?Intimate Partner Violence: Not on file  ? ? ?Outpatient Medications Prior to Visit  ?Medication Sig Dispense Refill  ? Cyanocobalamin (B-12) 1000 MCG SUBL Place 1 tablet under the tongue once a week. 30 tablet   ? cyclobenzaprine (FLEXERIL) 10 MG tablet Take 0.5-1 tablets (5-10 mg total) by mouth 2 (two) times daily as needed for muscle spasms. 30 tablet  3  ? gabapentin (NEURONTIN) 300 MG capsule Take 1 capsule (300 mg total) by mouth daily. 90 capsule 3  ? omeprazole (PRILOSEC) 20 MG capsule Take 1 capsule (20 mg total) by mouth daily. 90 capsule 3  ? Thiamine HCl (B-1) 100 MG TABS Take 1 tablet (100 mg total) by mouth once a week. 100 tablet 3  ? ?No facility-administered medications prior to visit.  ? ? ?No Known Allergies ? ?Review of Systems  ?Constitutional:  Negative for chills.  ?Eyes:  Negative for pain, discharge, redness, itching and visual disturbance.  ?Musculoskeletal:  Positive for arthralgias. Negative for joint swelling.  ?Neurological:  Positive for numbness. Negative for weakness.  ? ?   ?Objective:  ?  ?Physical Exam ?Vitals and nursing note reviewed.  ?Constitutional:   ?   Appearance: Normal appearance.  ?Eyes:  ?   General:     ?   Right eye: Hordeolum present.  ? ?Cardiovascular:  ?   Rate and Rhythm: Normal rate and regular rhythm.  ?Pulmonary:  ?   Breath sounds: Normal breath sounds.   ?Musculoskeletal:     ?   General: Tenderness and signs of injury present.  ?     Arms: ? ?Skin: ?   General: Skin is warm.  ?   Findings: Erythema and laceration present.  ?   Comments: Laceration to right posterior thumb was a week ago.  ?Neurological:  ?   General: No focal deficit present.  ?   Mental Status: He is alert.  ? ? ? ?BP 122/84   Pulse 91   Temp 97.7 ?F (36.5 ?C)   Resp 16   Ht 5\' 10"  (1.778 m)   Wt 182 lb 3 oz (82.6 kg)   SpO2 93%   BMI 26.14 kg/m?  ?Wt Readings from Last 3 Encounters:  ?11/02/21 182 lb 3 oz (82.6 kg)  ?08/22/21 187 lb 5 oz (85 kg)  ?04/18/20 171 lb 5 oz (77.7 kg)  ? ? ?Health Maintenance Due  ?Topic Date Due  ? HIV Screening  Never done  ? COLONOSCOPY (Pts 45-17yrs Insurance coverage will need to be confirmed)  Never done  ? Zoster Vaccines- Shingrix (1 of 2) Never done  ? COVID-19 Vaccine (4 - Booster for Pfizer series) 09/14/2020  ? ? ?There are no preventive care reminders to display for this patient. ? ? ?Lab Results  ?Component Value Date  ? TSH 0.63 04/21/2015  ? ?Lab Results  ?Component Value Date  ? WBC 6.3 08/22/2021  ? HGB 12.5 (L) 08/22/2021  ? HCT 37.8 (L) 08/22/2021  ? MCV 104.5 (H) 08/22/2021  ? PLT 213.0 08/22/2021  ? ?Lab Results  ?Component Value Date  ? NA 137 08/22/2021  ? K 4.0 08/22/2021  ? CO2 25 08/22/2021  ? GLUCOSE 89 08/22/2021  ? BUN 11 08/22/2021  ? CREATININE 0.93 08/22/2021  ? BILITOT 1.6 (H) 08/22/2021  ? ALKPHOS 51 08/22/2021  ? AST 61 (H) 08/22/2021  ? ALT 47 08/22/2021  ? PROT 7.2 08/22/2021  ? ALBUMIN 4.6 08/22/2021  ? CALCIUM 9.5 08/22/2021  ? GFR 93.69 08/22/2021  ? ?Lab Results  ?Component Value Date  ? CHOL 189 08/22/2021  ? ?Lab Results  ?Component Value Date  ? HDL 75.00 08/22/2021  ? ?Lab Results  ?Component Value Date  ? LDLCALC 96 08/22/2021  ? ?Lab Results  ?Component Value Date  ? TRIG 88.0 08/22/2021  ? ?Lab Results  ?Component Value Date  ? CHOLHDL 3 08/22/2021  ? ?  No results found for: HGBA1C ? ?   ?Assessment & Plan:   ? ?Problem List Items Addressed This Visit   ? ?  ? Other  ? Laceration of right thumb without damage to nail - Primary  ?  Laceration to the right posterior thumb.  Is been over a week.  Did update patient's tetanus vaccine in office.  Did instruct him to clean it daily with gentle soap and water.  Told him it would not heal if he does not immobilize thumb.  We did place a bulky dressing to thumb inclusive of nonadherent and Kerlix wrap.  We will have him follow-up in 1 week to see if her making any improvement ?  ?  ? Relevant Orders  ? Tdap vaccine greater than or equal to 7yo IM (Completed)  ? DG Finger Thumb Right (Completed)  ? Hordeolum externum of right lower eyelid  ?  Patient is without relief.  Seems to be getting larger.  Will send in erythromycin eye ointment 3 times daily for 7 days.  Patient does not wear contacts but does have over-the-counter readers. ?  ?  ? Relevant Medications  ? erythromycin ophthalmic ointment  ? ?Other Visit Diagnoses   ? ? Need for Tdap vaccination      ? Relevant Orders  ? Tdap vaccine greater than or equal to 7yo IM (Completed)  ? ?  ? ? ? ?Meds ordered this encounter  ?Medications  ? erythromycin ophthalmic ointment  ?  Sig: Place 1 application. into the right eye 3 (three) times daily. For a week  ?  Dispense:  3.5 g  ?  Refill:  0  ?  Order Specific Question:   Supervising Provider  ?  Answer:   Roxy Manns A [[id number]]  ? ? ? ?Audria Nine, NP ? ?

## 2021-11-08 ENCOUNTER — Other Ambulatory Visit: Payer: 59

## 2021-11-14 ENCOUNTER — Ambulatory Visit (INDEPENDENT_AMBULATORY_CARE_PROVIDER_SITE_OTHER)
Admission: RE | Admit: 2021-11-14 | Discharge: 2021-11-14 | Disposition: A | Discharge status: Home / Self Care | Payer: 59 | Source: Ambulatory Visit | Attending: Acute Care | Admitting: Acute Care

## 2021-11-14 DIAGNOSIS — Z87891 Personal history of nicotine dependence: Secondary | ICD-10-CM

## 2021-11-14 DIAGNOSIS — F17210 Nicotine dependence, cigarettes, uncomplicated: Secondary | ICD-10-CM

## 2021-11-15 ENCOUNTER — Ambulatory Visit: Payer: 59 | Admitting: Family Medicine

## 2021-11-17 ENCOUNTER — Other Ambulatory Visit: Payer: Self-pay

## 2021-11-17 DIAGNOSIS — F17210 Nicotine dependence, cigarettes, uncomplicated: Secondary | ICD-10-CM

## 2021-11-17 DIAGNOSIS — Z87891 Personal history of nicotine dependence: Secondary | ICD-10-CM

## 2021-11-17 DIAGNOSIS — Z122 Encounter for screening for malignant neoplasm of respiratory organs: Secondary | ICD-10-CM

## 2022-01-25 ENCOUNTER — Ambulatory Visit (INDEPENDENT_AMBULATORY_CARE_PROVIDER_SITE_OTHER): Payer: 59 | Admitting: Family Medicine

## 2022-01-25 ENCOUNTER — Encounter: Payer: Self-pay | Admitting: Family Medicine

## 2022-01-25 VITALS — BP 100/80 | HR 70 | Temp 98.5°F | Ht 70.0 in | Wt 177.4 lb

## 2022-01-25 DIAGNOSIS — S8012XA Contusion of left lower leg, initial encounter: Secondary | ICD-10-CM | POA: Diagnosis not present

## 2022-01-25 NOTE — Progress Notes (Signed)
    Kirk Bradley T. Venetia Prewitt, MD, CAQ Sports Medicine [facility] at [facility] [address] [place], [address]  Phone: [phone]  FAX: [phone]  Kirk Bradley - 56 y.o. male  MRN [id number]  Date of Birth: Oct 18, 1965  Date: 01/25/2022  PCP: Eustaquio Boyden, MD  Referral: Eustaquio Boyden, MD  Chief Complaint  Patient presents with   Knot on Left Leg with bruising   Subjective:   Kirk Bradley is a 56 y.o. very pleasant male patient with Body mass index is 25.45 kg/m. who presents with the following:  Patient presents with a knot and swelling on the dorsum of his foot with no known trauma.  This is really more at the distal anteromedial shin.  There is a fluid-filled area more adjacent to the medial bone.  There is also some bruising on the medial and lateral heel just inferior to the malleoli.  Review of Systems is noted in the HPI, as appropriate  Objective:   BP 100/80   Pulse 70   Temp 98.5 F (36.9 C) (Oral)   Ht 5\' 10"  (1.778 m)   Wt 177 lb 6 oz (80.5 kg)   SpO2 92%   BMI 25.45 kg/m   GEN: No acute distress; alert,appropriate. PULM: Breathing comfortably in no respiratory distress PSYCH: Normally interactive.   Some pain with compression at the area in question.  It is fluid-filled and there is some adjacent bruising and more noticeable in the hindfoot, calcaneal region inferior to the malleoli.  The entirety of the hindfoot, midfoot, and forefoot and all bony structures are nontender.  Laboratory and Imaging Data:  Assessment and Plan:     ICD-10-CM   1. Hematoma of left lower leg  S80.12XA      Will slowly resolve on its own over the course the next few weeks.  Bruising will likely in large and then fade.  Recommended compression and ice.  Dragon Medical One speech-to-text software was used for transcription in this dictation.  Possible transcriptional errors can occur using .    Signed,  Animal nutritionist. Nakshatra Klose, MD   Outpatient Encounter Medications as of 01/25/2022  Medication Sig   Cyanocobalamin (B-12) 1000 MCG SUBL Place 1 tablet under the tongue once a week.   cyclobenzaprine (FLEXERIL) 10 MG tablet Take 0.5-1 tablets (5-10 mg total) by mouth 2 (two) times daily as needed for muscle spasms.   gabapentin (NEURONTIN) 300 MG capsule Take 1 capsule (300 mg total) by mouth daily.   omeprazole (PRILOSEC) 20 MG capsule Take 1 capsule (20 mg total) by mouth daily.   Thiamine HCl (B-1) 100 MG TABS Take 1 tablet (100 mg total) by mouth once a week.   [DISCONTINUED] erythromycin ophthalmic ointment Place 1 application. into the right eye 3 (three) times daily. For a week   No facility-administered encounter medications on file as of 01/25/2022.

## 2022-02-16 ENCOUNTER — Ambulatory Visit (INDEPENDENT_AMBULATORY_CARE_PROVIDER_SITE_OTHER): Payer: 59 | Admitting: Family Medicine

## 2022-02-16 ENCOUNTER — Encounter: Payer: Self-pay | Admitting: Family Medicine

## 2022-02-16 VITALS — BP 122/84 | HR 74 | Temp 97.9°F | Ht 70.0 in | Wt 177.1 lb

## 2022-02-16 DIAGNOSIS — F1090 Alcohol use, unspecified, uncomplicated: Secondary | ICD-10-CM

## 2022-02-16 DIAGNOSIS — R61 Generalized hyperhidrosis: Secondary | ICD-10-CM

## 2022-02-16 DIAGNOSIS — R5383 Other fatigue: Secondary | ICD-10-CM

## 2022-02-16 DIAGNOSIS — R079 Chest pain, unspecified: Secondary | ICD-10-CM

## 2022-02-16 DIAGNOSIS — I2510 Atherosclerotic heart disease of native coronary artery without angina pectoris: Secondary | ICD-10-CM

## 2022-02-16 DIAGNOSIS — E519 Thiamine deficiency, unspecified: Secondary | ICD-10-CM | POA: Diagnosis not present

## 2022-02-16 DIAGNOSIS — F17200 Nicotine dependence, unspecified, uncomplicated: Secondary | ICD-10-CM

## 2022-02-16 DIAGNOSIS — D539 Nutritional anemia, unspecified: Secondary | ICD-10-CM | POA: Insufficient documentation

## 2022-02-16 DIAGNOSIS — M25472 Effusion, left ankle: Secondary | ICD-10-CM

## 2022-02-16 LAB — COMPREHENSIVE METABOLIC PANEL
ALT: 12 U/L (ref 0–53)
AST: 21 U/L (ref 0–37)
Albumin: 4.6 g/dL (ref 3.5–5.2)
Alkaline Phosphatase: 70 U/L (ref 39–117)
BUN: 10 mg/dL (ref 6–23)
CO2: 28 mEq/L (ref 19–32)
Calcium: 9.5 mg/dL (ref 8.4–10.5)
Chloride: 102 mEq/L (ref 96–112)
Creatinine, Ser: 0.81 mg/dL (ref 0.40–1.50)
GFR: 100.25 mL/min (ref >60.00)
Glucose, Bld: 87 mg/dL (ref 70–99)
Potassium: 4.6 mEq/L (ref 3.5–5.1)
Sodium: 138 mEq/L (ref 135–145)
Total Bilirubin: 1 mg/dL (ref 0.2–1.2)
Total Protein: 7.4 g/dL (ref 6.0–8.3)

## 2022-02-16 LAB — CBC WITH DIFFERENTIAL/PLATELET
Basophils Absolute: 0.1 10*3/uL (ref 0.0–0.1)
Basophils Relative: 1.3 % (ref 0.0–3.0)
Eosinophils Absolute: 0.7 10*3/uL (ref 0.0–0.7)
Eosinophils Relative: 9.5 % — ABNORMAL HIGH (ref 0.0–5.0)
HCT: 41.1 % (ref 39.0–52.0)
Hemoglobin: 13.7 g/dL (ref 13.0–17.0)
Lymphocytes Relative: 28.9 % (ref 12.0–46.0)
Lymphs Abs: 2.1 10*3/uL (ref 0.7–4.0)
MCHC: 33.2 g/dL (ref 30.0–36.0)
MCV: 100.1 fl — ABNORMAL HIGH (ref 78.0–100.0)
Monocytes Absolute: 0.6 10*3/uL (ref 0.1–1.0)
Monocytes Relative: 8.3 % (ref 3.0–12.0)
Neutro Abs: 3.8 10*3/uL (ref 1.4–7.7)
Neutrophils Relative %: 52 % (ref 43.0–77.0)
Platelets: 348 10*3/uL (ref 150.0–400.0)
RBC: 4.11 Mil/uL — ABNORMAL LOW (ref 4.22–5.81)
RDW: 12.7 % (ref 11.5–15.5)
WBC: 7.3 10*3/uL (ref 4.0–10.5)

## 2022-02-16 LAB — POC URINALSYSI DIPSTICK (AUTOMATED)
Blood, UA: NEGATIVE
Glucose, UA: NEGATIVE
Ketones, UA: NEGATIVE
Leukocytes, UA: NEGATIVE
Nitrite, UA: NEGATIVE
Protein, UA: NEGATIVE
Spec Grav, UA: 1.025 (ref 1.010–1.025)
Urobilinogen, UA: 1 E.U./dL
pH, UA: 6 (ref 5.0–8.0)

## 2022-02-16 LAB — FOLATE: Folate: >24.2 ng/mL (ref >5.9)

## 2022-02-16 LAB — SEDIMENTATION RATE: Sed Rate: 9 mm/hr (ref 0–20)

## 2022-02-16 LAB — VITAMIN B12: Vitamin B-12: >1504 pg/mL — ABNORMAL HIGH (ref 211–911)

## 2022-02-16 LAB — TSH: TSH: 0.72 u[IU]/mL (ref 0.35–5.50)

## 2022-02-16 NOTE — Patient Instructions (Addendum)
Labs today Urinalysis today.  EKG today.  Pass by lab for a stool kit.  Pending results we may send you to heart doctor.  Return to see Dr Lorelei Pont for left shoulder pain.

## 2022-02-16 NOTE — Progress Notes (Unsigned)
Patient ID: Kirk Bradley, male    DOB: 11-Aug-1965, 56 y.o.   MRN: [id number]  This visit was conducted in person.  BP 122/84   Pulse 74   Temp 97.9 F (36.6 C) (Temporal)   Ht 5\' 10"  (1.778 m)   Wt 177 lb 2 oz (80.3 kg)   SpO2 94%   BMI 25.41 kg/m    CC: L foot pain  Subjective:   HPI: Kirk Bradley is a 56 y.o. male presenting on 02/16/2022 for Foot Pain (C/o ongoing fL oot pain, bruising on bottom of fL oot and feels a lump on medial L ankle.  Seen on 01/25/22 by Dr. 01/27/22 for same sxs. Also c/o fatigue. )   Saw Dr Kirk Bradley last month for this - dx L lower leg hematoma with unknown trauma. Foot still painful and swollen, but lump has improved. Works on his feet all day.   Also with fatigue for last several months. Sleeping 12 hours/night. Occasional chills, night sweats a few times a week (for several weeks), sharp left chest pain, shortness of breath, chest pressure/tightness worse with exertion better with rest (for several months). HA.   No unexpected weight changes, abd pain, nausea, vomiting, diarrhea, blood in stool or urine, vision changes. No recent tick bites. No skin or hair changes.  Has not completed colon cancer screening.   Cutting down on alcohol - down to 2-6 drinks/day - beer or mixed drinks (vodka/juice).  Tobacco - continued smoker, 1-1.5 ppd. He is undergoing lung cancer screening CT program. Interested in quitting. Wellbutrin didn't help.      Relevant past medical, surgical, family and social history reviewed and updated as indicated. Interim medical history since our last visit reviewed. Allergies and medications reviewed and updated. Outpatient Medications Prior to Visit  Medication Sig Dispense Refill   Cyanocobalamin (B-12) 1000 MCG SUBL Place 1 tablet under the tongue once a week. 30 tablet    cyclobenzaprine (FLEXERIL) 10 MG tablet Take 0.5-1 tablets (5-10 mg total) by mouth 2 (two) times daily as needed for muscle spasms. 30 tablet 3    gabapentin (NEURONTIN) 300 MG capsule Take 1 capsule (300 mg total) by mouth daily. 90 capsule 3   omeprazole (PRILOSEC) 20 MG capsule Take 1 capsule (20 mg total) by mouth daily. 90 capsule 3   Thiamine HCl (B-1) 100 MG TABS Take 1 tablet (100 mg total) by mouth once a week. 100 tablet 3   No facility-administered medications prior to visit.     Per HPI unless specifically indicated in ROS section below Review of Systems  Objective:  BP 122/84   Pulse 74   Temp 97.9 F (36.6 C) (Temporal)   Ht 5\' 10"  (1.778 m)   Wt 177 lb 2 oz (80.3 kg)   SpO2 94%   BMI 25.41 kg/m   Wt Readings from Last 3 Encounters:  02/16/22 177 lb 2 oz (80.3 kg)  01/25/22 177 lb 6 oz (80.5 kg)  11/02/21 182 lb 3 oz (82.6 kg)      Physical Exam    Results for orders placed or performed in visit on 08/22/21  CBC with Differential/Platelet  Result Value Ref Range   WBC 6.3 4.0 - 10.5 K/uL   RBC 3.61 (L) 4.22 - 5.81 Mil/uL   Hemoglobin 12.5 (L) 13.0 - 17.0 g/dL   HCT 01/02/22 (L) 08/24/21 - 65.9 %   MCV 104.5 (H) 78.0 - 100.0 fl   MCHC 33.2 30.0 - 36.0 g/dL  RDW 15.1 11.5 - 15.5 %   Platelets 213.0 150.0 - 400.0 K/uL   Neutrophils Relative % 58.4 43.0 - 77.0 %   Lymphocytes Relative 29.8 12.0 - 46.0 %   Monocytes Relative 9.3 3.0 - 12.0 %   Eosinophils Relative 1.4 0.0 - 5.0 %   Basophils Relative 1.1 0.0 - 3.0 %   Neutro Abs 3.7 1.4 - 7.7 K/uL   Lymphs Abs 1.9 0.7 - 4.0 K/uL   Monocytes Absolute 0.6 0.1 - 1.0 K/uL   Eosinophils Absolute 0.1 0.0 - 0.7 K/uL   Basophils Absolute 0.1 0.0 - 0.1 K/uL  PSA  Result Value Ref Range   PSA 1.29 0.10 - 4.00 ng/mL  Folate  Result Value Ref Range   Folate >24.2 >5.9 ng/mL  Comprehensive metabolic panel  Result Value Ref Range   Sodium 137 135 - 145 mEq/L   Potassium 4.0 3.5 - 5.1 mEq/L   Chloride 103 96 - 112 mEq/L   CO2 25 19 - 32 mEq/L   Glucose, Bld 89 70 - 99 mg/dL   BUN 11 6 - 23 mg/dL   Creatinine, Ser 6.23 0.40 - 1.50 mg/dL   Total Bilirubin 1.6  (H) 0.2 - 1.2 mg/dL   Alkaline Phosphatase 51 39 - 117 U/L   AST 61 (H) 0 - 37 U/L   ALT 47 0 - 53 U/L   Total Protein 7.2 6.0 - 8.3 g/dL   Albumin 4.6 3.5 - 5.2 g/dL   GFR 76.28 >31.51 mL/min   Calcium 9.5 8.4 - 10.5 mg/dL  Lipid panel  Result Value Ref Range   Cholesterol 189 0 - 200 mg/dL   Triglycerides 76.1 0.0 - 149.0 mg/dL   HDL 60.73 >71.06 mg/dL   VLDL 26.9 0.0 - 48.5 mg/dL   LDL Cholesterol 96 0 - 99 mg/dL   Total CHOL/HDL Ratio 3    NonHDL 113.71   Vitamin B1  Result Value Ref Range   Vitamin B1 (Thiamine) 48 (H) 8 - 30 nmol/L  Vitamin B12  Result Value Ref Range   Vitamin B-12 >1504 (H) 211 - 911 pg/mL    Assessment & Plan:   Problem List Items Addressed This Visit     Habitual alcohol use   Smoker   Vitamin B1 deficiency   Relevant Orders   Vitamin B1   Fatigue - Primary   Relevant Orders   POCT Urinalysis Dipstick (Automated)   Sedimentation rate   Comprehensive metabolic panel   TSH   CBC with Differential/Platelet   Macrocytic anemia   Relevant Orders   Vitamin B12   Vitamin B1   Pathologist smear review   CBC with Differential/Platelet   Folate   Other Visit Diagnoses     Chest pain, unspecified type       Relevant Orders   EKG 12-Lead   Night sweats       Relevant Orders   Sedimentation rate   Serum protein electrophoresis with reflex   CBC with Differential/Platelet        No orders of the defined types were placed in this encounter.  Orders Placed This Encounter  Procedures   Sedimentation rate   Serum protein electrophoresis with reflex   Vitamin B12   Vitamin B1   Pathologist smear review   Comprehensive metabolic panel   TSH   CBC with Differential/Platelet   Folate   POCT Urinalysis Dipstick (Automated)   EKG 12-Lead     ***Follow up plan: Return if symptoms  worsen or fail to improve.  Ria Bush, MD

## 2022-02-17 DIAGNOSIS — R079 Chest pain, unspecified: Secondary | ICD-10-CM | POA: Insufficient documentation

## 2022-02-17 DIAGNOSIS — R61 Generalized hyperhidrosis: Secondary | ICD-10-CM | POA: Insufficient documentation

## 2022-02-17 DIAGNOSIS — M25472 Effusion, left ankle: Secondary | ICD-10-CM | POA: Insufficient documentation

## 2022-02-17 MED ORDER — ASPIRIN 81 MG PO TBEC: 81.0000 mg | DELAYED_RELEASE_TABLET | Freq: Every day | ORAL | Status: DC

## 2022-02-17 MED ORDER — ATORVASTATIN CALCIUM 40 MG PO TABS: 40.0000 mg | ORAL_TABLET | Freq: Every day | ORAL | 3 refills | Status: DC

## 2022-02-17 NOTE — Assessment & Plan Note (Signed)
CT finding.  Update EKG. Start aspirin, lipitor 40mg  daily.  Refer to cardiology.

## 2022-02-17 NOTE — Assessment & Plan Note (Addendum)
Continued 1+ ppd smoker.  Encouraged ongoing attempts towards cessation.  He is undergoing yearly lung cancer screening CT, latest 10/2021.

## 2022-02-17 NOTE — Assessment & Plan Note (Addendum)
Present for several weeks. No unexpected weight gain or enlarged lymph nodes. Further evaluate with labs today including TSH, peripheral smear, ESR.

## 2022-02-17 NOTE — Assessment & Plan Note (Signed)
Update b1 level. He continues thiamine 100mg  weekly.

## 2022-02-17 NOTE — Assessment & Plan Note (Addendum)
Continued heavy drinking but has cut down from previously. Encouraged continued decrease.  Update vitamin levels.

## 2022-02-17 NOTE — Assessment & Plan Note (Signed)
Bruising/hematoma with unknown cause, seems to be improving - reassurance provided.

## 2022-02-17 NOTE — Assessment & Plan Note (Signed)
Concerning description of chest pain, lung cancer screen showed prominent coronary artery calcification including LAD.  Will start aspirin 81mg , lipitor 40mg , refer urgently to cardiology.  Advised if recurrent chest pain to seek ER care.

## 2022-02-17 NOTE — Assessment & Plan Note (Signed)
H/o this, attributed to significant alcohol use - update labs. He has been regular with vitamin replacement.

## 2022-02-17 NOTE — Assessment & Plan Note (Signed)
Ongoing over several months.  Check labs for reversible causes.  ?heart related symptoms. See below.

## 2022-02-20 ENCOUNTER — Telehealth: Payer: Self-pay | Admitting: Hematology and Oncology

## 2022-02-20 ENCOUNTER — Other Ambulatory Visit: Payer: Self-pay | Admitting: Family Medicine

## 2022-02-20 ENCOUNTER — Telehealth: Payer: Self-pay | Admitting: Family Medicine

## 2022-02-20 DIAGNOSIS — D539 Nutritional anemia, unspecified: Secondary | ICD-10-CM

## 2022-02-20 DIAGNOSIS — R61 Generalized hyperhidrosis: Secondary | ICD-10-CM

## 2022-02-20 DIAGNOSIS — D721 Eosinophilia, unspecified: Secondary | ICD-10-CM | POA: Insufficient documentation

## 2022-02-20 DIAGNOSIS — R5383 Other fatigue: Secondary | ICD-10-CM

## 2022-02-20 NOTE — Telephone Encounter (Signed)
Scheduled appt per 7/25 referral. Pt is aware of appt date and time. Pt is aware to arrive 15 mins prior to appt time and to bring and updated insurance card. Pt is aware of appt location.   

## 2022-02-20 NOTE — Telephone Encounter (Signed)
I do not see where patient has checked MyChart with lab results.  Please call and notify below. He's already been scheduled for cards appt and heme appt. If he doesn't use mychart, let us know and we will deactivate.   We're still waiting on a few other labs but your vitamin B12 remains too high - go ahead and stop b12. If you're taking any folic acid vitamin, stop that too as it was also high.  Your blood counts were actually better - no longer anemic.  Your kidneys, liver, sugar and thyroid returned normal.  I do want you to take a baby aspirin daily as well as start a cholesterol medicine lipitor 40mg  which I've sent to your pharmacy. Take this at least until we see the heart doctor - we will call you to schedule a heart doctor appointment for further evaluation of this chest pain. If recurrent chest pain, call 911 or go to the ER for evaluation.   One type of blood count returned elevated (eosinophils). This can be seen with allergies, asthma, parasite infections, or sometimes a blood issue - given your other symptoms, I recommend having you see a blood doctor (hematologist). We will call you for an appointment.

## 2022-02-21 ENCOUNTER — Ambulatory Visit (INDEPENDENT_AMBULATORY_CARE_PROVIDER_SITE_OTHER): Payer: 59 | Admitting: Family Medicine

## 2022-02-21 ENCOUNTER — Encounter: Payer: Self-pay | Admitting: Family Medicine

## 2022-02-21 VITALS — BP 100/80 | HR 83 | Temp 98.2°F | Ht 70.0 in | Wt 176.0 lb

## 2022-02-21 DIAGNOSIS — Z23 Encounter for immunization: Secondary | ICD-10-CM

## 2022-02-21 DIAGNOSIS — M7582 Other shoulder lesions, left shoulder: Secondary | ICD-10-CM

## 2022-02-21 DIAGNOSIS — M7552 Bursitis of left shoulder: Secondary | ICD-10-CM

## 2022-02-21 MED ORDER — TRIAMCINOLONE ACETONIDE 40 MG/ML IJ SUSP
40.0000 mg | Freq: Once | INTRAMUSCULAR | Status: AC
  Administered 2022-02-21: 40 mg via INTRA_ARTICULAR

## 2022-02-21 NOTE — Progress Notes (Signed)
Kirk Bradley T. Damoni Erker, MD, CAQ Sports Medicine [facility] at [facility] [address] [place], [address]  Phone: [phone]  FAX: [phone]  Kirk Bradley - 56 y.o. male  MRN [id number]  Date of Birth: 02-06-1966  Date: 02/21/2022  PCP: Eustaquio Boyden, MD  Referral: Eustaquio Boyden, MD  Chief Complaint  Patient presents with   Shoulder Pain    Left    Subjective:   Kirk Bradley is a 56 y.o. very pleasant male patient with Body mass index is 25.25 kg/m. 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  He has a painful arc of motion in the plane of abduction internal range of motion with his left shoulder.  He has no acute injury, and this has been ongoing now for on the order of months.  It is slowly and progressively been getting worse.  He is right-hand dominant.  Does have a history of having some right-sided rotator cuff tendinopathy and subacromial bursitis roughly 1 decade ago.   Review of Systems is noted in the HPI, as appropriate  Objective:   BP 100/80   Pulse 83   Temp 98.2 F (36.8 C) (Oral)   Ht 5\' 10"  (1.778 m)   Wt 176 lb (79.8 kg)   SpO2 94%   BMI 25.25 kg/m   GEN: No acute distress; alert,appropriate. PULM: Breathing comfortably in no respiratory distress PSYCH: Normally interactive.    Shoulder: L Inspection: No muscle wasting or winging Ecchymosis/edema: neg  AC joint, scapula, clavicle: NT Cervical spine: NT, full ROM Spurling's: neg Abduction: full, 5/5 Flexion: full, 5/5 IR, full, lift-off: 5/5 ER at neutral: full, 5/5 AC crossover: neg Neer: pos Hawkins: pos Drop Test: neg Empty Can: pos Supraspinatus insertion: mild-mod T Bicipital groove: NT Speed's: neg Yergason's: neg Sulcus sign: neg Scapular dyskinesis: none C5-T1 intact  Neuro: Sensation intact Grip 5/5   Laboratory and Imaging  Data:  Assessment and Plan:     ICD-10-CM   1. Rotator cuff tendonitis, left  M75.82     2. Subacromial bursitis of left shoulder joint  M75.52      Very strong.  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  No concern for tear.  Work on range of motion, relative rest.  Tylenol 1 g p.o. 3 times daily as needed.  We are also going to do a subacromial injection today see if we can acutely calm him down.  Aspiration/Injection Procedure Note Kirk Bradley 09-Sep-1965 Date of procedure: 02/21/2022  Procedure: Large Joint Aspiration / Injection of L Shoulder,Subacromial Indications: Pain  Procedure Details Verbal consent was obtained from the patient. Risks, benefits, and alternatives were explained. Patient prepped with Chloraprep and Ethyl Chloride used for anesthesia. The subacromial space was injected using the posterior approach. The patient tolerated the procedure well and had decreased pain post injection. No complications. Injection: 9 cc of Lidocaine 1% and 1 mL of Kenalog 40 mg. Needle: 22 gauge Medication: 1 mL of Kenalog 40 mg    Dragon [employer] One speech-to-text software was used for transcription in this dictation.  Possible transcriptional errors can occur using 02/23/2022.   Signed,  Animal nutritionist. Kirk Buffa, MD   Outpatient Encounter Medications as of 02/21/2022  Medication Sig   acetaminophen (TYLENOL) 500 MG tablet Take 500 mg by mouth in the morning.  aspirin EC 81 MG tablet Take 1 tablet (81 mg total) by mouth daily. Swallow whole.   atorvastatin (LIPITOR) 40 MG tablet Take 1 tablet (40 mg total) by mouth daily.   cyclobenzaprine (FLEXERIL) 10 MG tablet Take 0.5-1 tablets (5-10 mg total) by mouth 2 (two) times daily as needed for muscle spasms.   gabapentin (NEURONTIN) 300 MG capsule Take 1 capsule (300 mg total) by mouth daily.   omeprazole (PRILOSEC) 20 MG capsule  Take 1 capsule (20 mg total) by mouth daily.   Thiamine HCl (B-1) 100 MG TABS Take 1 tablet (100 mg total) by mouth once a week.   No facility-administered encounter medications on file as of 02/21/2022.

## 2022-02-21 NOTE — Addendum Note (Signed)
Addended by: Damita Lack on: 02/21/2022 03:08 PM   Modules accepted: Orders

## 2022-02-21 NOTE — Telephone Encounter (Signed)
Looks like pt viewed results on 02/20/22 at 7:34 AM.

## 2022-02-22 ENCOUNTER — Encounter: Payer: Self-pay | Admitting: Family Medicine

## 2022-02-22 DIAGNOSIS — R778 Other specified abnormalities of plasma proteins: Secondary | ICD-10-CM | POA: Insufficient documentation

## 2022-02-22 LAB — PROTEIN ELECTROPHORESIS, SERUM, WITH REFLEX
Albumin ELP: 4.2 g/dL (ref 3.8–4.8)
Alpha 1: 0.5 g/dL — ABNORMAL HIGH (ref 0.2–0.3)
Alpha 2: 0.7 g/dL (ref 0.5–0.9)
Beta 2: 0.5 g/dL (ref 0.2–0.5)
Beta Globulin: 0.4 g/dL (ref 0.4–0.6)
Gamma Globulin: 1.2 g/dL (ref 0.8–1.7)
Total Protein: 7.4 g/dL (ref 6.1–8.1)

## 2022-02-22 LAB — PATHOLOGIST SMEAR REVIEW

## 2022-02-22 LAB — VITAMIN B1: Vitamin B1 (Thiamine): 7 nmol/L — ABNORMAL LOW (ref 8–30)

## 2022-02-24 ENCOUNTER — Ambulatory Visit (HOSPITAL_BASED_OUTPATIENT_CLINIC_OR_DEPARTMENT_OTHER): Payer: 59

## 2022-02-26 ENCOUNTER — Other Ambulatory Visit: Payer: Self-pay | Admitting: Family Medicine

## 2022-02-26 MED ORDER — B-1 100 MG PO TABS: 1.0000 | ORAL_TABLET | ORAL | Status: DC

## 2022-02-26 NOTE — Addendum Note (Signed)
Addended by: Eustaquio Boyden on: 02/26/2022 07:22 AM   Modules accepted: Orders

## 2022-03-12 ENCOUNTER — Ambulatory Visit: Payer: 59 | Admitting: Internal Medicine

## 2022-03-12 ENCOUNTER — Encounter: Payer: Self-pay | Admitting: Internal Medicine

## 2022-03-12 VITALS — BP 100/70 | HR 84 | Ht 70.0 in | Wt 177.0 lb

## 2022-03-12 DIAGNOSIS — R072 Precordial pain: Secondary | ICD-10-CM

## 2022-03-12 DIAGNOSIS — E782 Mixed hyperlipidemia: Secondary | ICD-10-CM | POA: Diagnosis not present

## 2022-03-12 DIAGNOSIS — I2584 Coronary atherosclerosis due to calcified coronary lesion: Secondary | ICD-10-CM

## 2022-03-12 DIAGNOSIS — I2510 Atherosclerotic heart disease of native coronary artery without angina pectoris: Secondary | ICD-10-CM

## 2022-03-12 DIAGNOSIS — Z720 Tobacco use: Secondary | ICD-10-CM

## 2022-03-12 MED ORDER — IVABRADINE HCL 7.5 MG PO TABS
ORAL_TABLET | ORAL | 0 refills | Status: DC
Start: 2022-03-12 — End: 2022-04-11

## 2022-03-12 NOTE — Patient Instructions (Addendum)
Medication Instructions:  Your physician recommends that you continue on your current medications as directed. Please refer to the Current Medication list given to you today.  *If you need a refill on your cardiac medications before your next appointment, please call your pharmacy*   Lab Work: TODAY: BMP If you have labs (blood work) drawn today and your tests are completely normal, you will receive your results only by: MyChart Message (if you have MyChart) OR A paper copy in the mail If you have any lab test that is abnormal or we need to change your treatment, we will call you to review the results.   Testing/Procedures: Your physician has requested that you have cardiac CT. Cardiac computed tomography (CT) is a painless test that uses an x-ray machine to take clear, detailed pictures of your heart. For further information please visit [email or web address] Please follow instruction sheet as given.     Follow-Up: At [facility], you and your health needs are our priority.  As part of our continuing mission to provide you with exceptional heart care, we have created designated Provider Care Teams.  These Care Teams include your primary Cardiologist (physician) and Advanced Practice Providers (APPs -  Physician Assistants and Nurse Practitioners) who all work together to provide you with the care you need, when you need it.  Your next appointment:   5 month(s)  The format for your next appointment:   In Person  Provider:   Ronie Spies, PA-C, Jacolyn Reedy, PA-C, or Eligha Bridegroom, NP        Other Instructions   Your cardiac CT will be scheduled at one of the below locations:   [facility] [address] [place], [place] [address] [phone]  OR  [facility] [address] [place], [place] [address] [phone]  If scheduled at [facility], please arrive at the [facility] and Children's Entrance  (Entrance C2) of [facility] 30 minutes prior to test start time. You can use the FREE valet parking offered at entrance C (encouraged to control the heart rate for the test)  Proceed to the [facility] Radiology Department (first floor) to check-in and test prep.  All radiology patients and guests should use entrance C2 at [facility], accessed from [facility], even though the hospital's physical address listed is [address].    If scheduled at [facility], please arrive 15 mins early for check-in and test prep.  Please follow these instructions carefully (unless otherwise directed):  Hold all erectile dysfunction medications at least 3 days (72 hrs) prior to test.  On the Night Before the Test: Be sure to Drink plenty of water. Do not consume any caffeinated/decaffeinated beverages or chocolate 12 hours prior to your test. Do not take any antihistamines 12 hours prior to your test.   On the Day of the Test: Drink plenty of water until 1 hour prior to the test. Do not eat any food 4 hours prior to the test. You may take your regular medications prior to the test.  Take ivabradine (Corlanor) 15 mg two hours prior to test. HOLD Furosemide/Hydrochlorothiazide morning of the test.        After the Test: Drink plenty of water. After receiving IV contrast, you may experience a mild flushed feeling. This is normal. On occasion, you may experience a mild rash up to 24 hours after the test. This is not dangerous. If this occurs,  you can take Benadryl 25 mg and increase your fluid intake. If you experience trouble breathing, this can be serious. If it is severe call 911 IMMEDIATELY. If it is mild, please call our office. If you take any of these medications: Glipizide/Metformin, Avandament, Glucavance, please do not take 48 hours after completing test unless otherwise instructed.  We will call to schedule your test 2-4 weeks  out understanding that some insurance companies will need an authorization prior to the service being performed.   For non-scheduling related questions, please contact the cardiac imaging nurse navigator should you have any questions/concerns: Rockwell Alexandria, Cardiac Imaging Nurse Navigator Larey Brick, Cardiac Imaging Nurse Navigator [facility] Direct Office Dial: [phone]   For scheduling needs, including cancellations and rescheduling, please call [place], [phone].   Important Information About Sugar

## 2022-03-12 NOTE — Progress Notes (Signed)
Cardiology Office Note:    Date:  03/12/2022   ID:  Kirk Bradley, DOB April 29, 1966, MRN [id number]  PCP:  Eustaquio Boyden, MD   [facility] Providers Cardiologist:  Christell Constant, MD     Referring MD: Eustaquio Boyden, MD   CC: Chest pain Consulted for the evaluation of chest pain at the behest of Dr. Morton Stall  History of Present Illness:    Kirk Bradley is a 56 y.o. male with a hx of CAC, Active Smoker, Abnormal SPEP NOS, and CP who presents for evaluation.  Patient notes that he is feeling chest pain.   Has chest pain that comes and goes.  HE has had this pain since 2020. He has no clear trigger: it comes and goes and it is on the left and right side of his chest. No change with activity.  No change at night or after different foods.    Landscaping and dry wall work.  No provocation.  He is weaning himself off cigarettes and is down to 1 pack per day.  No shortness of breath, DOE .  No PND or orthopnea.  No weight gain, leg swelling , or abdominal swelling.  No syncope or near syncope . Notes  no palpitations or funny heart beats.     No prior heart testing.  No Fhx CAD.   Past Medical History:  Diagnosis Date   Alcohol abuse    GERD (gastroesophageal reflux disease)    Osteoarthritis    Smoker     Past Surgical History:  Procedure Laterality Date   NO PAST SURGERIES      Current Medications: Current Meds  Medication Sig   acetaminophen (TYLENOL) 500 MG tablet Take 500 mg by mouth in the morning.   aspirin EC 81 MG tablet Take 1 tablet (81 mg total) by mouth daily. Swallow whole.   atorvastatin (LIPITOR) 40 MG tablet Take 1 tablet (40 mg total) by mouth daily.   cyclobenzaprine (FLEXERIL) 10 MG tablet Take 0.5-1 tablets (5-10 mg total) by mouth 2 (two) times daily as needed for muscle spasms.   gabapentin (NEURONTIN) 300 MG capsule Take 1 capsule (300 mg total) by mouth daily.   ivabradine (CORLANOR) 7.5 MG TABS tablet Take 2  hours prior to Cardiac CT   omeprazole (PRILOSEC) 20 MG capsule Take 1 capsule (20 mg total) by mouth daily.   Thiamine HCl (B-1) 100 MG TABS Take 1 tablet (100 mg total) by mouth every Monday, Wednesday, and Friday.     Allergies:   Patient has no known allergies.   Social History   Socioeconomic History   Marital status: Married    Spouse name: Not on file   Number of children: Not on file   Years of education: Not on file   Highest education level: Not on file  Occupational History   Not on file  Tobacco Use   Smoking status: Every Day    Packs/day: 1.50    Years: 34.00    Total pack years: 51.00    Types: Cigarettes    Start date: 07/30/1986   Smokeless tobacco: Never   Tobacco comments:    1-2 ppd  Substance and Sexual Activity   Alcohol use: Yes    Alcohol/week: 0.0 standard drinks of alcohol    Comment: 12-18 pack beer/day   Drug use: Yes    Comment: MJ - occasional   Sexual activity: Not on file  Other Topics Concern   Not on file  Social History  Narrative   Lives with wife and 8 dogs   Occupation: works in [employer] representative   Edu: GED   Activity: very active at work   Diet: good water, fruits/vegetables daily   Social Determinants of Corporate investment banker Strain: Not on Ship broker Insecurity: Not on file  Transportation Needs: Not on file  Physical Activity: Not on file  Stress: Not on file  Social Connections: Not on file     Family History: The patient's family history includes Alcoholism in his father, maternal aunt, maternal grandfather, and maternal uncle; Cancer in his maternal uncle; Pulmonary embolism in his mother; Rheum arthritis in his mother. There is no history of CAD or Diabetes.  ROS:   Please see the history of present illness.     All other systems reviewed and are negative.  EKGs/Labs/Other Studies Reviewed:    The following studies were reviewed today:  EKG:  02/17/22: SR rate iRBBB  CT Lung Screen: Date:11/14/21 Results:  LAD CAC   Recent Labs: 02/16/2022: ALT 12; BUN 10; Creatinine, Ser 0.81; Hemoglobin 13.7; Platelets 348.0; Potassium 4.6; Sodium 138; TSH 0.72  Recent Lipid Panel    Component Value Date/Time   CHOL 189 08/22/2021 1159   TRIG 88.0 08/22/2021 1159   HDL 75.00 08/22/2021 1159   CHOLHDL 3 08/22/2021 1159   VLDL 17.6 08/22/2021 1159   LDLCALC 96 08/22/2021 1159         Physical Exam:    VS:  BP 100/70 (BP Location: Left Arm, Patient Position: Sitting, Cuff Size: Normal)   Pulse 84   Ht 5\' 10"  (1.778 m)   Wt 177 lb (80.3 kg)   BMI 25.40 kg/m     Wt Readings from Last 3 Encounters:  03/12/22 177 lb (80.3 kg)  02/21/22 176 lb (79.8 kg)  02/16/22 177 lb 2 oz (80.3 kg)     GEN:  Well nourished, well developed in no acute distress HEENT: Normal NECK: No JVD; No carotid bruits LYMPHATICS: No lymphadenopathy CARDIAC: RRR, no murmurs, rubs, gallops RESPIRATORY:  Clear to auscultation without rales, wheezing or rhonchi  ABDOMEN: Soft, non-tender, non-distended MUSCULOSKELETAL:  No edema; No deformity  SKIN: Warm and dry NEUROLOGIC:  Alert and oriented x 3 PSYCHIATRIC:  Normal affect   ASSESSMENT:    1. Precordial pain   2. Mixed hyperlipidemia   3. Coronary artery calcification   4. Tobacco abuse    PLAN:    Precordial CP HLD Tobacco use - reviewed CT with patient - continue ASA, statin for now, post procedure will likely set more aggressive LDL goal  - Cardiac CT with ivabradine - If pLAD is ischemic will add nitro and bring back to discuss PCI - needs to stop smoking, discussed with patient and wife who stopped smoking recently  Winter me or APP       Medication Adjustments/Labs and Tests Ordered: Current medicines are reviewed at length with the patient today.  Concerns regarding medicines are outlined above.  Orders Placed This Encounter  Procedures   CT CORONARY MORPH W/CTA COR W/SCORE W/CA W/CM &/OR WO/CM   Basic metabolic panel   Meds ordered this  encounter  Medications   ivabradine (CORLANOR) 7.5 MG TABS tablet    Sig: Take 2 hours prior to Cardiac CT    Dispense:  2 tablet    Refill:  0    Patient Instructions  Medication Instructions:  Your physician recommends that you continue on your current medications as directed. Please refer  to the Current Medication list given to you today.  *If you need a refill on your cardiac medications before your next appointment, please call your pharmacy*   Lab Work: TODAY: BMP If you have labs (blood work) drawn today and your tests are completely normal, you will receive your results only by: MyChart Message (if you have MyChart) OR A paper copy in the mail If you have any lab test that is abnormal or we need to change your treatment, we will call you to review the results.   Testing/Procedures: Your physician has requested that you have cardiac CT. Cardiac computed tomography (CT) is a painless test that uses an x-ray machine to take clear, detailed pictures of your heart. For further information please visit [email or web address] Please follow instruction sheet as given.     Follow-Up: At [facility], you and your health needs are our priority.  As part of our continuing mission to provide you with exceptional heart care, we have created designated Provider Care Teams.  These Care Teams include your primary Cardiologist (physician) and Advanced Practice Providers (APPs -  Physician Assistants and Nurse Practitioners) who all work together to provide you with the care you need, when you need it.  Your next appointment:   5 month(s)  The format for your next appointment:   In Person  Provider:   Ronie Spies, PA-C, Jacolyn Reedy, PA-C, or Eligha Bridegroom, NP        Other Instructions   Your cardiac CT will be scheduled at one of the below locations:   [facility] [address] [place], [place] [address] [phone]  OR  [facility] [address] [place], [place] [address] [phone]  If scheduled at [facility], please arrive at the [facility] and Children's Entrance (Entrance C2) of [facility] 30 minutes prior to test start time. You can use the FREE valet parking offered at entrance C (encouraged to control the heart rate for the test)  Proceed to the [facility] Radiology Department (first floor) to check-in and test prep.  All radiology patients and guests should use entrance C2 at [facility], accessed from [facility], even though the hospital's physical address listed is [address]    If scheduled at [facility], please arrive 15 mins early for check-in and test prep.  Please follow these instructions carefully (unless otherwise directed):  Hold all erectile dysfunction medications at least 3 days (72 hrs) prior to test.  On the Night Before the Test: Be sure to Drink plenty of water. Do not consume any caffeinated/decaffeinated beverages or chocolate 12 hours prior to your test. Do not take any antihistamines 12 hours prior to your test.   On the Day of the Test: Drink plenty of water until 1 hour prior to the test. Do not eat any food 4 hours prior to the test. You may take your regular medications prior to the test.  Take ivabradine (Corlanor) 15 mg two hours prior to test. HOLD Furosemide/Hydrochlorothiazide morning of the test.        After the Test: Drink plenty of water. After receiving IV contrast, you may experience a mild flushed feeling. This is normal. On occasion, you may experience a mild rash up to 24 hours after the test. This is not dangerous. If this occurs, you can take Benadryl 25 mg and increase your fluid intake. If you experience trouble breathing, this  can be serious. If it is severe call 911 IMMEDIATELY. If it is mild, please call our office. If you take any of these  medications: Glipizide/Metformin, Avandament, Glucavance, please do not take 48 hours after completing test unless otherwise instructed.  We will call to schedule your test 2-4 weeks out understanding that some insurance companies will need an authorization prior to the service being performed.   For non-scheduling related questions, please contact the cardiac imaging nurse navigator should you have any questions/concerns: Rockwell Alexandria, Cardiac Imaging Nurse Navigator Larey Brick, Cardiac Imaging Nurse Navigator [facility] Direct Office Dial: [phone]   For scheduling needs, including cancellations and rescheduling, please call [place], [phone].   Important Information About Sugar         Signed, Christell Constant, MD  03/12/2022 2:55 PM    [facility]

## 2022-03-13 LAB — BASIC METABOLIC PANEL WITH GFR
BUN/Creatinine Ratio: 16 (ref 9–20)
BUN: 15 mg/dL (ref 6–24)
CO2: 23 mmol/L (ref 20–29)
Calcium: 9.4 mg/dL (ref 8.7–10.2)
Chloride: 101 mmol/L (ref 96–106)
Creatinine, Ser: 0.95 mg/dL (ref 0.76–1.27)
Glucose: 75 mg/dL (ref 70–99)
Potassium: 4.3 mmol/L (ref 3.5–5.2)
Sodium: 139 mmol/L (ref 134–144)
eGFR: 95 mL/min/1.73 (ref >59)

## 2022-03-15 ENCOUNTER — Inpatient Hospital Stay: Payer: 59 | Attending: Hematology and Oncology | Admitting: Hematology and Oncology

## 2022-03-15 ENCOUNTER — Other Ambulatory Visit: Payer: Self-pay

## 2022-03-15 ENCOUNTER — Inpatient Hospital Stay: Payer: 59

## 2022-03-15 VITALS — BP 107/71 | HR 96 | Temp 98.1°F | Resp 18 | Ht 70.0 in | Wt 184.3 lb

## 2022-03-15 DIAGNOSIS — Z79899 Other long term (current) drug therapy: Secondary | ICD-10-CM | POA: Diagnosis not present

## 2022-03-15 DIAGNOSIS — D721 Eosinophilia, unspecified: Secondary | ICD-10-CM | POA: Diagnosis present

## 2022-03-15 DIAGNOSIS — F17210 Nicotine dependence, cigarettes, uncomplicated: Secondary | ICD-10-CM | POA: Insufficient documentation

## 2022-03-15 DIAGNOSIS — Z7982 Long term (current) use of aspirin: Secondary | ICD-10-CM | POA: Diagnosis not present

## 2022-03-15 LAB — CBC WITH DIFFERENTIAL/PLATELET
Abs Immature Granulocytes: 0.02 10*3/uL (ref 0.00–0.07)
Basophils Absolute: 0.1 10*3/uL (ref 0.0–0.1)
Basophils Relative: 1 %
Eosinophils Absolute: 0.1 10*3/uL (ref 0.0–0.5)
Eosinophils Relative: 2 %
HCT: 39.5 % (ref 39.0–52.0)
Hemoglobin: 13.5 g/dL (ref 13.0–17.0)
Immature Granulocytes: 0 %
Lymphocytes Relative: 29 %
Lymphs Abs: 2.3 10*3/uL (ref 0.7–4.0)
MCH: 33 pg (ref 26.0–34.0)
MCHC: 34.2 g/dL (ref 30.0–36.0)
MCV: 96.6 fL (ref 80.0–100.0)
Monocytes Absolute: 0.6 10*3/uL (ref 0.1–1.0)
Monocytes Relative: 7 %
Neutro Abs: 4.9 10*3/uL (ref 1.7–7.7)
Neutrophils Relative %: 61 %
Platelets: 213 10*3/uL (ref 150–400)
RBC: 4.09 MIL/uL — ABNORMAL LOW (ref 4.22–5.81)
RDW: 11.7 % (ref 11.5–15.5)
WBC: 8 10*3/uL (ref 4.0–10.5)
nRBC: 0 % (ref 0.0–0.2)

## 2022-03-15 LAB — CMP (CANCER CENTER ONLY)
ALT: 26 U/L (ref 0–44)
AST: 27 U/L (ref 15–41)
Albumin: 4 g/dL (ref 3.5–5.0)
Alkaline Phosphatase: 63 U/L (ref 38–126)
Anion gap: 4 — ABNORMAL LOW (ref 5–15)
BUN: 16 mg/dL (ref 6–20)
CO2: 28 mmol/L (ref 22–32)
Calcium: 9 mg/dL (ref 8.9–10.3)
Chloride: 108 mmol/L (ref 98–111)
Creatinine: 1.09 mg/dL (ref 0.61–1.24)
GFR, Estimated: >60 mL/min (ref >60)
Glucose, Bld: 102 mg/dL — ABNORMAL HIGH (ref 70–99)
Potassium: 4.4 mmol/L (ref 3.5–5.1)
Sodium: 140 mmol/L (ref 135–145)
Total Bilirubin: 0.5 mg/dL (ref 0.3–1.2)
Total Protein: 7 g/dL (ref 6.5–8.1)

## 2022-03-15 LAB — TECHNOLOGIST SMEAR REVIEW: Plt Morphology: NORMAL

## 2022-03-15 LAB — LACTATE DEHYDROGENASE: LDH: 172 U/L (ref 98–192)

## 2022-03-15 NOTE — Progress Notes (Signed)
[facility] CONSULT NOTE  Patient Care Team: Eustaquio Boyden, MD as PCP - General (Family Medicine) Christell Constant, MD as PCP - Cardiology (Cardiology)  CHIEF COMPLAINTS/PURPOSE OF CONSULTATION:  [name],  ASSESSMENT & PLAN:  No problem-specific Assessment & Plan notes found for this encounter.  Orders Placed This Encounter  Procedures   CBC with Differential/Platelet    Standing Status:   Standing    Number of Occurrences:   22    Standing Expiration Date:   03/16/2023   Comprehensive metabolic panel    Standing Status:   Standing    Number of Occurrences:   33    Standing Expiration Date:   03/16/2023   Lactate dehydrogenase    Standing Status:   Future    Number of Occurrences:   1    Standing Expiration Date:   03/16/2023   Flow Cytometry, Peripheral Blood (Oncology)    Standing Status:   Future    Number of Occurrences:   1    Standing Expiration Date:   03/16/2023   Technologist smear review    Order Specific Question:   Clinical information:    Answer:   eosinophilia   This is a pleasant 56 year old male patient with past medical history significant for heavy alcohol ingestion who recently presented to his PCP with worsening fatigue, sleeping almost 14 hours a day and some random chest pains.  He had some labs which showed some eosinophilia although the CBC with differential did not comment on any absolute eosinophilia.  This was however mentioned in the smear.  He was then recommended to see hematology.  He was also diagnosed with vitamin B1 deficiency recently but has not started any supplementation yet.  Physical examination today unremarkable.  We have repeated CBC which did not show any evidence of eosinophilia.  No evidence of hemolysis.  No other leukocytosis or abnormal differential noted.  Flow cytometry pending. At this time I have discussed that he should start supplementing B1 as soon as possible to avoid any complications such as  beriberi. He should also continue follow-up with cardiology for monitoring given his random chest pains. At this time since his labs are quite unremarkable and no evidence of any hematological abnormality,he can follow-up with hematology as needed. I will also follow up on flow results when available. I called him, no answer, called his wife and gave her the results.  Thank you for consulting [place] the care of this patient.  Please do not hesitate contact us with any additional questions or concerns.  HISTORY OF PRESENTING ILLNESS:  Kirk Bradley 56 y.o. male is here because of eosinophilia.  This is a very old 56 yr old male patient with history of alcohol ingestion ( about 6 mixed drinks) a day for many many years who initially reported to his PCP with fatigue and chest pain. Fatigue has been bothersome for the past 2 months, sleeps 14 hrs. No other B symptoms He also reports bilateral chest pain, cant quite describe the pain, lasts for a few seconds, no aggravating factors, relieved on its own. No orthopnea or PND.  He has noted some RLE swelling which resolved on its own. NO seasonal allergies. He has good appetite.  He is now established with a cardiologist who is planning to do his coronary CT.  He also was diagnosed with B1 deficiency but has not been taking his thiamine as prescribed. 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  He denies any changes in  breathing or changes in urinary habits. Rest of the pertinent 10 point ROS reviewed and negative.  REVIEW OF SYSTEMS:   Constitutional: Denies fevers, chills or abnormal night sweats Eyes: Denies blurriness of vision, double vision or watery eyes Ears, nose, mouth, throat, and face: Denies mucositis or sore throat Respiratory: Denies cough, dyspnea or wheezes Cardiovascular: Denies palpitation, chest discomfort or lower extremity swelling Gastrointestinal:  Denies nausea, heartburn or change in bowel habits Skin: Denies abnormal  skin rashes Lymphatics: Denies new lymphadenopathy or easy bruising Neurological:Denies numbness, tingling or new weaknesses Behavioral/Psych: Mood is stable, no new changes  All other systems were reviewed with the patient and are negative.  MEDICAL HISTORY:  Past Medical History:  Diagnosis Date   Alcohol abuse    GERD (gastroesophageal reflux disease)    Osteoarthritis    Smoker     SURGICAL HISTORY: Past Surgical History:  Procedure Laterality Date   NO PAST SURGERIES      SOCIAL HISTORY: Social History   Socioeconomic History   Marital status: Married    Spouse name: Not on file   Number of children: Not on file   Years of education: Not on file   Highest education level: Not on file  Occupational History   Not on file  Tobacco Use   Smoking status: Every Day    Packs/day: 1.50    Years: 34.00    Total pack years: 51.00    Types: Cigarettes    Start date: 07/30/1986   Smokeless tobacco: Never   Tobacco comments:    1-2 ppd  Substance and Sexual Activity   Alcohol use: Yes    Alcohol/week: 0.0 standard drinks of alcohol    Comment: 12-18 pack beer/day   Drug use: Yes    Comment: MJ - occasional   Sexual activity: Not on file  Other Topics Concern   Not on file  Social History Narrative   Lives with wife and 8 dogs   Occupation: works in Holiday representative   Edu: GED   Activity: very active at work   Diet: good water, fruits/vegetables daily   Social Determinants of Corporate investment banker Strain: Not on Ship broker Insecurity: Not on file  Transportation Needs: Not on file  Physical Activity: Not on file  Stress: Not on file  Social Connections: Not on file  Intimate Partner Violence: Not on file    FAMILY HISTORY: Family History  Problem Relation Age of Onset   Rheum arthritis Mother    Pulmonary embolism Mother    Alcoholism Father    Alcoholism Maternal Grandfather    Alcoholism Maternal Aunt    Alcoholism Maternal Uncle    Cancer  Maternal Uncle        unsure, agent orange exposure in [place]   CAD Neg Hx    Diabetes Neg Hx     ALLERGIES:  has No Known Allergies.  MEDICATIONS:  Current Outpatient Medications  Medication Sig Dispense Refill   acetaminophen (TYLENOL) 500 MG tablet Take 500 mg by mouth in the morning.     aspirin EC 81 MG tablet Take 1 tablet (81 mg total) by mouth daily. Swallow whole.     atorvastatin (LIPITOR) 40 MG tablet Take 1 tablet (40 mg total) by mouth daily. 90 tablet 3   cyclobenzaprine (FLEXERIL) 10 MG tablet Take 0.5-1 tablets (5-10 mg total) by mouth 2 (two) times daily as needed for muscle spasms. 30 tablet 3   gabapentin (NEURONTIN) 300 MG capsule Take  1 capsule (300 mg total) by mouth daily. 90 capsule 3   ivabradine (CORLANOR) 7.5 MG TABS tablet Take 2 hours prior to Cardiac CT 2 tablet 0   omeprazole (PRILOSEC) 20 MG capsule Take 1 capsule (20 mg total) by mouth daily. 90 capsule 3   Thiamine HCl (B-1) 100 MG TABS Take 1 tablet (100 mg total) by mouth every Monday, Wednesday, and Friday.     No current facility-administered medications for this visit.     PHYSICAL EXAMINATION: ECOG PERFORMANCE STATUS: 0 - Asymptomatic  Vitals:   03/15/22 1421  BP: 107/71  Pulse: 96  Resp: 18  Temp: 98.1 F (36.7 C)  SpO2: 97%   Filed Weights   03/15/22 1421  Weight: 184 lb 4.8 oz (83.6 kg)    GENERAL:alert, no distress and comfortable SKIN: skin color, texture, turgor are normal, no rashes or significant lesions EYES: normal, conjunctiva are pink and non-injected, sclera clear OROPHARYNX:no exudate, no erythema and lips, buccal mucosa, and tongue normal  NECK: supple, thyroid normal size, non-tender, without nodularity LYMPH:  no palpable lymphadenopathy in the cervical, axillary or inguinal LUNGS: clear to auscultation and percussion with normal breathing effort HEART: regular rate & rhythm and no murmurs and no lower extremity edema ABDOMEN:abdomen soft, non-tender and normal  bowel sounds Musculoskeletal:no cyanosis of digits and no clubbing  PSYCH: alert & oriented x 3 with fluent speech NEURO: no focal motor/sensory deficits  LABORATORY DATA:  I have reviewed the data as listed Lab Results  Component Value Date   WBC 8.0 03/15/2022   HGB 13.5 03/15/2022   HCT 39.5 03/15/2022   MCV 96.6 03/15/2022   PLT 213 03/15/2022     Chemistry      Component Value Date/Time   NA 140 03/15/2022 1539   NA 139 03/12/2022 1444   K 4.4 03/15/2022 1539   CL 108 03/15/2022 1539   CO2 28 03/15/2022 1539   BUN 16 03/15/2022 1539   BUN 15 03/12/2022 1444   CREATININE 1.09 03/15/2022 1539      Component Value Date/Time   CALCIUM 9.0 03/15/2022 1539   ALKPHOS 63 03/15/2022 1539   AST 27 03/15/2022 1539   ALT 26 03/15/2022 1539   BILITOT 0.5 03/15/2022 1539      Most recent labs from July 21 showed a white blood cell count of 7300, hemoglobin at 13.7, mild macrocytosis, no evidence of thrombocytopenia.  He was referred for eosinophilia although her absolute eosinophil count is normal   Labs from August 18 showed a white blood cell count of 8000, hemoglobin of 13.5, platelet count of 213,000.  No eosinophilia noted on her labs from yesterday. CMP showed no evidence of renal dysfunction.  Normal liver function tests.  LDH is normal Flow cytometry in process.  Technologist smear review showed no evidence of eosinophilia.  RADIOGRAPHIC STUDIES: I have personally reviewed the radiological images as listed and agreed with the findings in the report. No results found.  All questions were answered. The patient knows to call the clinic with any problems, questions or concerns. I spent 45 minutes in the care of this patient including H and P, review of records, counseling and coordination of care.     Rachel Moulds, MD 03/16/2022 10:25 AM

## 2022-03-16 ENCOUNTER — Encounter: Payer: Self-pay | Admitting: Hematology and Oncology

## 2022-03-16 ENCOUNTER — Telehealth: Payer: Self-pay | Admitting: Hematology and Oncology

## 2022-03-16 LAB — SURGICAL PATHOLOGY

## 2022-03-16 NOTE — Telephone Encounter (Signed)
Contacted patient to scheduled appointments. Patient is aware of appointments that are scheduled.   

## 2022-03-19 LAB — FLOW CYTOMETRY

## 2022-03-26 ENCOUNTER — Other Ambulatory Visit: Payer: Self-pay | Admitting: Internal Medicine

## 2022-03-30 ENCOUNTER — Telehealth (HOSPITAL_COMMUNITY): Payer: Self-pay | Admitting: *Deleted

## 2022-03-30 NOTE — Telephone Encounter (Signed)
Reaching out to patient to offer assistance regarding upcoming cardiac imaging study; pt verbalizes understanding of appt date/time, parking situation and where to check in, pre-test NPO status and medications ordered, and verified current allergies; name and call back number provided for further questions should they arise  Larey Brick RN Navigator Cardiac Imaging [facility] [phone] office [phone] cell  Patient to take 15mg  ivabradine two hours prior to his cardiac CT scan. He is aware to arrive at 2pm.

## 2022-04-03 ENCOUNTER — Ambulatory Visit (HOSPITAL_COMMUNITY)
Admission: RE | Admit: 2022-04-03 | Discharge: 2022-04-03 | Disposition: A | Discharge status: Home / Self Care | Payer: Commercial Managed Care - HMO | Source: Ambulatory Visit | Attending: Internal Medicine | Admitting: Internal Medicine

## 2022-04-03 DIAGNOSIS — R072 Precordial pain: Secondary | ICD-10-CM | POA: Insufficient documentation

## 2022-04-03 MED ORDER — METOPROLOL TARTRATE 5 MG/5ML IV SOLN
5.0000 mg | INTRAVENOUS | Status: DC | PRN
  Administered 2022-04-03: 5 mg via INTRAVENOUS

## 2022-04-03 MED ORDER — NITROGLYCERIN 0.4 MG SL SUBL
SUBLINGUAL_TABLET | SUBLINGUAL | Status: DC
Start: 2022-04-03 — End: 2022-04-04
  Filled 2022-04-03: qty 1

## 2022-04-03 MED ORDER — METOPROLOL TARTRATE 5 MG/5ML IV SOLN
INTRAVENOUS | Status: AC
  Filled 2022-04-03: qty 5

## 2022-04-03 MED ORDER — IOHEXOL 350 MG/ML SOLN
100.0000 mL | Freq: Once | INTRAVENOUS | Status: AC | PRN
  Administered 2022-04-03: 100 mL via INTRAVENOUS

## 2022-04-03 MED ORDER — NITROGLYCERIN 0.4 MG SL SUBL
0.8000 mg | SUBLINGUAL_TABLET | Freq: Once | SUBLINGUAL | Status: AC
  Administered 2022-04-03: 0.8 mg via SUBLINGUAL

## 2022-04-03 MED ORDER — NITROGLYCERIN 0.4 MG SL SUBL
SUBLINGUAL_TABLET | SUBLINGUAL | Status: AC
  Filled 2022-04-03: qty 2

## 2022-04-04 ENCOUNTER — Telehealth: Payer: Self-pay | Admitting: Internal Medicine

## 2022-04-04 MED ORDER — APIXABAN 5 MG PO TABS: 5.0000 mg | ORAL_TABLET | Freq: Two times a day (BID) | ORAL | 3 refills | Status: AC

## 2022-04-04 NOTE — Telephone Encounter (Signed)
Vicente Serene is calling stating Dr. Allegra Lai is requesting to speak with Dr. Izora Ribas to discuss a critical finding on pt's morph CT. Due to Dr. Izora Ribas not being in he is requesting the call be transferred to DOD.

## 2022-04-04 NOTE — Telephone Encounter (Signed)
Called pt advised of incidental finding of bilateral PE noted on Cardiac CT.  Reviewed red flags s/s that would require immediate attention.  Also reviewed medication Eliquis bleeding precautions.  Pt told to come by the office today to pick up Eliquis samples.  I have left 1 month of Eliquis samples, a 30 day free trial card, $10 co pay card and information for pt assistance at the front desk for pt to pick up.

## 2022-04-04 NOTE — Telephone Encounter (Signed)
Dr. Elberta Fortis, DOD received call from radiology about CT finding. Pulmonary emboli seen. Dr. Elberta Fortis spoke with Dr. Izora Ribas. Order to start pt on Eliquis and schedule f/u w/ Chandrasekhar in 1-2 weeks. Forwarding to Chandrasekhar's nurse to call pt and inform, order & schedle

## 2022-04-11 ENCOUNTER — Encounter: Payer: Self-pay | Admitting: Internal Medicine

## 2022-04-11 ENCOUNTER — Ambulatory Visit: Payer: Commercial Managed Care - HMO | Attending: Internal Medicine | Admitting: Internal Medicine

## 2022-04-11 VITALS — BP 98/60 | HR 97 | Ht 72.0 in | Wt 187.4 lb

## 2022-04-11 DIAGNOSIS — Z720 Tobacco use: Secondary | ICD-10-CM | POA: Diagnosis not present

## 2022-04-11 DIAGNOSIS — I2510 Atherosclerotic heart disease of native coronary artery without angina pectoris: Secondary | ICD-10-CM | POA: Diagnosis not present

## 2022-04-11 DIAGNOSIS — I2584 Coronary atherosclerosis due to calcified coronary lesion: Secondary | ICD-10-CM | POA: Diagnosis not present

## 2022-04-11 DIAGNOSIS — I2699 Other pulmonary embolism without acute cor pulmonale: Secondary | ICD-10-CM | POA: Diagnosis not present

## 2022-04-11 NOTE — Patient Instructions (Addendum)
Medication Instructions:  Your physician has recommended you make the following change in your medication:  STOP: Aspirin   *If you need a refill on your cardiac medications before your next appointment, please call your pharmacy*   Lab Work: NONE If you have labs (blood work) drawn today and your tests are completely normal, you will receive your results only by: MyChart Message (if you have MyChart) OR A paper copy in the mail If you have any lab test that is abnormal or we need to change your treatment, we will call you to review the results.   Testing/Procedures: Your physician has requested that you have an echocardiogram. Echocardiography is a painless test that uses sound waves to create images of your heart. It provides your doctor with information about the size and shape of your heart and how well your heart's chambers and valves are working. This procedure takes approximately one hour. There are no restrictions for this procedure.  IN DECEMBER: CT PE without contrast.    Follow-Up: At [facility], you and your health needs are our priority.  As part of our continuing mission to provide you with exceptional heart care, we have created designated Provider Care Teams.  These Care Teams include your primary Cardiologist (physician) and Advanced Practice Providers (APPs -  Physician Assistants and Nurse Practitioners) who all work together to provide you with the care you need, when you need it.  We recommend signing up for the patient portal called "MyChart".  Sign up information is provided on this After Visit Summary.  MyChart is used to connect with patients for Virtual Visits (Telemedicine).  Patients are able to view lab/test results, encounter notes, upcoming appointments, etc.  Non-urgent messages can be sent to your provider as well.   To learn more about what you can do with MyChart, go to [email or web address].    Your next appointment:   3-4 month(s)  The  format for your next appointment:   In Person  Provider:   Christell Constant, MD     Important Information About Sugar

## 2022-04-11 NOTE — Progress Notes (Signed)
Cardiology Office Note:    Date:  04/11/2022   ID:  Kirk Bradley, DOB 03/24/1966, MRN [id number]  PCP:  Eustaquio Boyden, MD   [facility] Providers Cardiologist:  Christell Constant, MD     Referring MD: Eustaquio Boyden, MD   CC: Follow up PE  History of Present Illness:    Kirk Bradley is a 56 y.o. male with a hx of CAC, Active Smoker, Abnormal SPEP NOS, and CP who presents for evaluation in 2023.  Had CP and found to have PE.  Started on blood thinners.  Brought back for evaluation.  Patient notes that he is doing well.   Since last visit notes occasional bleeding . There are no interval hospital/ED visit.    No change in chest pain.  No SOB/DOE and no PND/Orthopnea.  No weight gain or leg swelling.  No palpitations or syncope .   Past Medical History:  Diagnosis Date   Alcohol abuse    GERD (gastroesophageal reflux disease)    Osteoarthritis    Smoker     Past Surgical History:  Procedure Laterality Date   NO PAST SURGERIES      Current Medications: Current Meds  Medication Sig   acetaminophen (TYLENOL) 500 MG tablet Take 500 mg by mouth in the morning.   apixaban (ELIQUIS) 5 MG TABS tablet Take 1 tablet (5 mg total) by mouth 2 (two) times daily.   atorvastatin (LIPITOR) 40 MG tablet Take 1 tablet (40 mg total) by mouth daily.   cyclobenzaprine (FLEXERIL) 10 MG tablet Take 0.5-1 tablets (5-10 mg total) by mouth 2 (two) times daily as needed for muscle spasms.   gabapentin (NEURONTIN) 300 MG capsule Take 1 capsule (300 mg total) by mouth daily.   omeprazole (PRILOSEC) 20 MG capsule Take 1 capsule (20 mg total) by mouth daily.   Thiamine HCl (B-1) 100 MG TABS Take 1 tablet (100 mg total) by mouth every Monday, Wednesday, and Friday.   [DISCONTINUED] aspirin EC 81 MG tablet Take 1 tablet (81 mg total) by mouth daily. Swallow whole.   [DISCONTINUED] ivabradine (CORLANOR) 7.5 MG TABS tablet Take 2 hours prior to Cardiac CT     Allergies:    Patient has no known allergies.   Social History   Socioeconomic History   Marital status: Married    Spouse name: Not on file   Number of children: Not on file   Years of education: Not on file   Highest education level: Not on file  Occupational History   Not on file  Tobacco Use   Smoking status: Every Day    Packs/day: 1.50    Years: 34.00    Total pack years: 51.00    Types: Cigarettes    Start date: 07/30/1986   Smokeless tobacco: Never   Tobacco comments:    1-2 ppd  Substance and Sexual Activity   Alcohol use: Yes    Alcohol/week: 0.0 standard drinks of alcohol    Comment: 12-18 pack beer/day   Drug use: Yes    Comment: MJ - occasional   Sexual activity: Not on file  Other Topics Concern   Not on file  Social History Narrative   Lives with wife and 8 dogs   Occupation: works in [employer]   Edu: GED   Activity: very active at work   Diet: good water, fruits/vegetables daily   Social Determinants of Corporate investment banker Strain: Not on file  Food Insecurity: Not on Chartered certified accountant  Needs: Not on file  Physical Activity: Not on file  Stress: Not on file  Social Connections: Not on file     Family History: The patient's family history includes Alcoholism in his father, maternal aunt, maternal grandfather, and maternal uncle; Cancer in his maternal uncle; Pulmonary embolism in his mother; Rheum arthritis in his mother. There is no history of CAD or Diabetes.  ROS:   Please see the history of present illness.     All other systems reviewed and are negative.  EKGs/Labs/Other Studies Reviewed:    The following studies were reviewed today:  EKG:  02/17/22: SR rate iRBBB  CT Lung Screen: Date:11/14/21 Results: LAD CAC  CT CORONARY MORPH W/CTA COR W/SCORE W/CA W/CM &/OR WO/CM 04/03/2022  Addendum 04/04/2022  1:56 PM ADDENDUM REPORT: 04/04/2022 13:54  HISTORY: Chest pain, nonspecific  EXAM: Cardiac/Coronary CT  TECHNIQUE: The patient  was scanned on a [employer].  PROTOCOL: A 120 kV prospective scan was triggered in the descending thoracic aorta at 111 HU's. Axial non-contrast 3 mm slices were carried out through the heart. The data set was analyzed on a dedicated work station and scored using the Agatston method. Gantry rotation speed was 250 msecs and collimation was .6 mm. Heart rate was optimized medically and sl NTG was given. The 3D data set was reconstructed in 5% intervals of the 35-75 % of the R-R cycle. Systolic and diastolic phases were analyzed on a dedicated work station using MPR, MIP and VRT modes. The patient received OMNIPAQUE IOHEXOL 350 MG/ML SOLN of contrast.  FINDINGS: Coronary calcium score: The patient's coronary artery calcium score is 136, which places the patient in the 85th percentile.  Coronary arteries: Normal coronary origins.  Right dominance.  Right Coronary Artery: Normal caliber vessel, gives rise to PDA. Scattered proximal noncalcified plaque with 1-24% stenosis.  Left Main Coronary Artery: Normal caliber vessel. Ostial noncalcified plaque with trivial stenosis.  Left Anterior Descending Coronary Artery: Normal caliber vessel. Diffuse mixed calcified and noncalcified plaque throughout the proximal LAD with 25-49% stenosis. Gives rise to three diagonal branches.  Left Circumflex Artery: Small caliber vessel. Proximal noncalcified plaque with 1-24% stenosis. Gives rise to one small OM branch.  Aorta: Normal size, 30 mm at the mid ascending aorta (level of the PA bifurcation) measured double oblique. No aortic atherosclerosis. No dissection seen in visualized portions of the aorta.  Aortic Valve: No calcifications. Trileaflet.  Other findings:  Normal pulmonary vein drainage into the left atrium.  Normal left atrial appendage without a thrombus.  Normal size of the pulmonary artery. Evidence of pulmonary embolism, see read from [facility].  Normal appearance of the pericardium.  IMPRESSION: 1. Mild nonobstructive CAD, CADRADS = 2. Diffuse mixed calcified and noncalcified plaque throughout the proximal LAD with 25-49% stenosis.  2. Coronary calcium score of 136. This was 85th percentile for age and sex matched control.  3. Normal coronary origin with right dominance.  4. Evidence of pulmonary embolism, see read from [facility]. Findings communicated to Dr. Izora Ribas.  INTERPRETATION:  CAD-RADS 2: Mild non-obstructive CAD (25-49%). Consider non-atherosclerotic causes of chest pain. Consider preventive therapy and risk factor modification.   Electronically Signed By: Jodelle Red M.D. On: 04/04/2022 13:54  Narrative EXAM: OVER-READ INTERPRETATION  CT CHEST  The following report is a limited chest CT over-read performed by radiologist Dr. Allegra Lai of [facility] on 04/03/2022. This over-read does not include interpretation of cardiac or coronary anatomy or pathology. The  coronary CTA interpretation by the cardiologist is attached.  COMPARISON:  CT chest dated November 14, 2021  FINDINGS: Vascular: Normal heart size. No pericardial effusion. Filling defects of the bilateral lobar pulmonary arteries, some of which have a linear configuration. Segmental filling defect of the medial right middle lobe pulmonary artery. Normal caliber thoracic aorta with no significant atherosclerotic disease.  Mediastinum/Nodes: No enlarged mediastinal or axillary lymph nodes. Thyroid gland, trachea, and esophagus demonstrate no significant findings.  Lungs/Pleura: Central airways are patent. Cavitary masslike opacity of the peripheral superior portion of the right lower lobe with central ground-glass measuring 4.8 x 2.2 cm on series limited image 6. Additional linear nodular opacities seen in the inferior portion of the right lower lobe on series 2, image 45.  Upper  Abdomen: No acute abnormality.  Musculoskeletal: Old posterior left-sided rib fractures. No chest wall mass or suspicious bone lesions identified.  IMPRESSION: 1. Pulmonary embolus is seen in the bilateral lobar pulmonary arteries, many of the filling defects have a linear configuration which is compatible with subacute/chronic pulmonary embolus, however acute on chronic can not be excluded. 2. Cavitary masslike opacity of the peripheral right lower lobe with central ground-glass, likely due to pulmonary infarct, although infection or malignancy could also have this appearance. Recommend follow-up chest CT in 3 months to ensure resolution. 3. Additional linear nodular opacity is seen in the inferior portion of the right lower lobe, likely due to scarring. Recommend attention on follow-up.  Critical Value/emergent results were called by telephone at the time of interpretation on 04/04/2022 at 1:29 pm to provider Dr. Baird Kay, who verbally acknowledged these results.  Electronically Signed: By: Yetta Glassman M.D. On: 04/04/2022 13:30   No results found for this or any previous visit from the past 3650 days.     Recent Labs: 02/16/2022: TSH 0.72 03/15/2022: ALT 26; BUN 16; Creatinine 1.09; Hemoglobin 13.5; Platelets 213; Potassium 4.4; Sodium 140  Recent Lipid Panel    Component Value Date/Time   CHOL 189 08/22/2021 1159   TRIG 88.0 08/22/2021 1159   HDL 75.00 08/22/2021 1159   CHOLHDL 3 08/22/2021 1159   VLDL 17.6 08/22/2021 1159   LDLCALC 96 08/22/2021 1159         Physical Exam:    VS:  BP 98/60   Pulse 97   Ht 6' (1.829 m)   Wt 187 lb 6.4 oz (85 kg)   SpO2 95%   BMI 25.42 kg/m     Wt Readings from Last 3 Encounters:  04/11/22 187 lb 6.4 oz (85 kg)  03/15/22 184 lb 4.8 oz (83.6 kg)  03/12/22 177 lb (80.3 kg)    GEN:  Well nourished, well developed in no acute distress HEENT: Normal NECK: No JVD; No carotid bruits LYMPHATICS: No lymphadenopathy CARDIAC:  RRR, no murmurs, rubs, gallops RESPIRATORY:  Clear to auscultation without rales, wheezing or rhonchi  ABDOMEN: Soft, non-tender, non-distended MUSCULOSKELETAL:  No edema; No deformity  SKIN: Warm and dry NEUROLOGIC:  Alert and oriented x 3, prominent left radial pulsation with bruit. PSYCHIATRIC:  Normal affect   ASSESSMENT:    1. Pulmonary embolism, other, unspecified chronicity, unspecified whether acute cor pulmonale present ([place])   2. Coronary artery calcification   3. Tobacco abuse     PLAN:    Pulmonary embolism - unprovoked, on DOAC - Will get Echo - will do CT PE in three months - will alert hematology  Mild non obstructive CAD HLD Tobacco use - reviewed CT with patient -  stop ASA - statin once this has resolved - Stop smoking, continue to reduce drinking  Three month f/u me       Medication Adjustments/Labs and Tests Ordered: Current medicines are reviewed at length with the patient today.  Concerns regarding medicines are outlined above.  Orders Placed This Encounter  Procedures   CT Angio Chest Pulmonary Embolism (PE) W or WO Contrast   ECHOCARDIOGRAM COMPLETE   No orders of the defined types were placed in this encounter.   Patient Instructions  Medication Instructions:  Your physician has recommended you make the following change in your medication:  STOP: Aspirin   *If you need a refill on your cardiac medications before your next appointment, please call your pharmacy*   Lab Work: NONE If you have labs (blood work) drawn today and your tests are completely normal, you will receive your results only by: [place] (if you have MyChart) OR A paper copy in the mail If you have any lab test that is abnormal or we need to change your treatment, we will call you to review the results.   Testing/Procedures: Your physician has requested that you have an echocardiogram. Echocardiography is a painless test that uses sound waves to create  images of your heart. It provides your doctor with information about the size and shape of your heart and how well your heart's chambers and valves are working. This procedure takes approximately one hour. There are no restrictions for this procedure.  IN DECEMBER: CT PE without contrast.    Follow-Up: At [facility], you and your health needs are our priority.  As part of our continuing mission to provide you with exceptional heart care, we have created designated Provider Care Teams.  These Care Teams include your primary Cardiologist (physician) and Advanced Practice Providers (APPs -  Physician Assistants and Nurse Practitioners) who all work together to provide you with the care you need, when you need it.  We recommend signing up for the patient portal called "MyChart".  Sign up information is provided on this After Visit Summary.  MyChart is used to connect with patients for Virtual Visits (Telemedicine).  Patients are able to view lab/test results, encounter notes, upcoming appointments, etc.  Non-urgent messages can be sent to your provider as well.   To learn more about what you can do with MyChart, go to [email or web address].    Your next appointment:   3-4 month(s)  The format for your next appointment:   In Person  Provider:   Werner Lean, MD     Important Information About Sugar         Signed, Werner Lean, MD  04/11/2022 2:25 PM    [place]

## 2022-04-18 ENCOUNTER — Encounter: Payer: Self-pay | Admitting: Hematology and Oncology

## 2022-04-18 ENCOUNTER — Inpatient Hospital Stay: Payer: Commercial Managed Care - HMO | Attending: Hematology and Oncology | Admitting: Hematology and Oncology

## 2022-04-18 ENCOUNTER — Other Ambulatory Visit: Payer: Self-pay

## 2022-04-18 VITALS — BP 133/97 | HR 91 | Temp 98.1°F | Resp 16 | Ht 72.0 in | Wt 176.6 lb

## 2022-04-18 DIAGNOSIS — I2699 Other pulmonary embolism without acute cor pulmonale: Secondary | ICD-10-CM | POA: Diagnosis not present

## 2022-04-18 DIAGNOSIS — D721 Eosinophilia, unspecified: Secondary | ICD-10-CM | POA: Insufficient documentation

## 2022-04-18 DIAGNOSIS — Z79899 Other long term (current) drug therapy: Secondary | ICD-10-CM | POA: Diagnosis not present

## 2022-04-18 DIAGNOSIS — Z7901 Long term (current) use of anticoagulants: Secondary | ICD-10-CM | POA: Diagnosis not present

## 2022-04-18 DIAGNOSIS — F17210 Nicotine dependence, cigarettes, uncomplicated: Secondary | ICD-10-CM | POA: Insufficient documentation

## 2022-04-18 DIAGNOSIS — Z86711 Personal history of pulmonary embolism: Secondary | ICD-10-CM | POA: Diagnosis present

## 2022-04-18 NOTE — Progress Notes (Signed)
[facility] CONSULT NOTE  Patient Care Team: Ria Bush, MD as PCP - General (Family Medicine) Werner Lean, MD as PCP - Cardiology (Cardiology)  CHIEF COMPLAINTS/PURPOSE OF CONSULTATION:  [name], pulmonary embolism and infarction  ASSESSMENT & PLAN:   This is a pleasant 56 year old male patient with past medical history significant for heavy alcohol ingestion who recently presented to his PCP with worsening fatigue, sleeping almost 14 hours a day and some random chest pains.   Since last visit [name] was diagnosed with a PE and was started on anticoagulation.  This was detected on CT cardiac evaluation.  He had months of chest pain hence was undergoing cardiology evaluation.  He continues to have this random chest pains without any change since he started anticoagulation.  Physical examination today, lungs are clear to auscultation bilaterally, no tachycardia.  We have discussed about 3 to 6 months of anticoagulation.  He does have family history of pulmonary embolism in his mom as we have discussed about considering hypercoagulable work-up at the end of anticoagulation to see if he needs extended anticoagulation.  He is okay with this plan.   I also ordered repeat CT which was recommended by radiology to follow-up on the pulmonary infarct. Once again we have discussed about smoking and alcohol cessation.  Thank you for consulting [place] the care of this patient.   Please do not hesitate contact us with any additional questions or concerns.  HISTORY OF PRESENTING ILLNESS:   Kirk Bradley 56 y.o. male is here because of eosinophilia.  Interval history He initially was referred to [place] with eosinophilia which has resolved.  Since his last visit however he was diagnosed with PE noted on a cardiac CT evaluation and he was started on Eliquis and he was sent back to discuss anticoagulation recommendations.  He continues to have the random chest pain  without a clear aggravating or relieving factor.   He is very compliant with Eliquis and denies any issues with bleeding.   Rest of the pertinent 10 point ROS reviewed and negative  MEDICAL HISTORY:  Past Medical History:  Diagnosis Date   Alcohol abuse    GERD (gastroesophageal reflux disease)    Osteoarthritis    Smoker     SURGICAL HISTORY: Past Surgical History:  Procedure Laterality Date   NO PAST SURGERIES      SOCIAL HISTORY: Social History   Socioeconomic History   Marital status: Married    Spouse name: Not on file   Number of children: Not on file   Years of education: Not on file   Highest education level: Not on file  Occupational History   Not on file  Tobacco Use   Smoking status: Every Day    Packs/day: 1.50    Years: 34.00    Total pack years: 51.00    Types: Cigarettes    Start date: 07/30/1986   Smokeless tobacco: Never   Tobacco comments:    1-2 ppd  Substance and Sexual Activity   Alcohol use: Yes    Alcohol/week: 0.0 standard drinks of alcohol    Comment: 12-18 pack beer/day   Drug use: Yes    Comment: MJ - occasional   Sexual activity: Not on file  Other Topics Concern   Not on file  Social History Narrative   Lives with wife and 8 dogs   Occupation: works in Architect   Edu: GED   Activity: very active at work   Diet: good water, fruits/vegetables  daily   Social Determinants of Health   Financial Resource Strain: Not on file  Food Insecurity: Not on file  Transportation Needs: Not on file  Physical Activity: Not on file  Stress: Not on file  Social Connections: Not on file  Intimate Partner Violence: Not on file    FAMILY HISTORY: Family History  Problem Relation Age of Onset   Rheum arthritis Mother    Pulmonary embolism Mother    Alcoholism Father    Alcoholism Maternal Grandfather    Alcoholism Maternal Aunt    Alcoholism Maternal Uncle    Cancer Maternal Uncle        unsure, agent orange exposure in [place]    CAD Neg Hx    Diabetes Neg Hx     ALLERGIES:  has No Known Allergies.  MEDICATIONS:  Current Outpatient Medications  Medication Sig Dispense Refill   acetaminophen (TYLENOL) 500 MG tablet Take 500 mg by mouth in the morning.     apixaban (ELIQUIS) 5 MG TABS tablet Take 1 tablet (5 mg total) by mouth 2 (two) times daily. 180 tablet 3   atorvastatin (LIPITOR) 40 MG tablet Take 1 tablet (40 mg total) by mouth daily. 90 tablet 3   cyclobenzaprine (FLEXERIL) 10 MG tablet Take 0.5-1 tablets (5-10 mg total) by mouth 2 (two) times daily as needed for muscle spasms. 30 tablet 3   gabapentin (NEURONTIN) 300 MG capsule Take 1 capsule (300 mg total) by mouth daily. 90 capsule 3   omeprazole (PRILOSEC) 20 MG capsule Take 1 capsule (20 mg total) by mouth daily. 90 capsule 3   Thiamine HCl (B-1) 100 MG TABS Take 1 tablet (100 mg total) by mouth every Monday, Wednesday, and Friday.     No current facility-administered medications for this visit.     PHYSICAL EXAMINATION: ECOG PERFORMANCE STATUS: 0 - Asymptomatic  There were no vitals filed for this visit.  There were no vitals filed for this visit.   GENERAL:alert, no distress and comfortable LUNGS: clear to auscultation and percussion with normal breathing effort HEART: regular rate & rhythm and no murmurs and no lower extremity edema  LABORATORY DATA:  I have reviewed the data as listed Lab Results  Component Value Date   WBC 8.0 03/15/2022   HGB 13.5 03/15/2022   HCT 39.5 03/15/2022   MCV 96.6 03/15/2022   PLT 213 03/15/2022     Chemistry      Component Value Date/Time   NA 140 03/15/2022 1539   NA 139 03/12/2022 1444   K 4.4 03/15/2022 1539   CL 108 03/15/2022 1539   CO2 28 03/15/2022 1539   BUN 16 03/15/2022 1539   BUN 15 03/12/2022 1444   CREATININE 1.09 03/15/2022 1539      Component Value Date/Time   CALCIUM 9.0 03/15/2022 1539   ALKPHOS 63 03/15/2022 1539   AST 27 03/15/2022 1539   ALT 26 03/15/2022 1539   BILITOT  0.5 03/15/2022 1539       RADIOGRAPHIC STUDIES: I have personally reviewed the radiological images as listed and agreed with the findings in the report. CT CORONARY MORPH W/CTA COR W/SCORE W/CA W/CM &/OR WO/CM  Addendum Date: 04/04/2022   ADDENDUM REPORT: 04/04/2022 13:54 HISTORY: Chest pain, nonspecific EXAM: Cardiac/Coronary CT TECHNIQUE: The patient was scanned on a [employer]. PROTOCOL: A 120 kV prospective scan was triggered in the descending thoracic aorta at 111 HU's. Axial non-contrast 3 mm slices were carried out through the heart. The data set  was analyzed on a dedicated work station and scored using the Agatston method. Gantry rotation speed was 250 msecs and collimation was .6 mm. Heart rate was optimized medically and sl NTG was given. The 3D data set was reconstructed in 5% intervals of the 35-75 % of the R-R cycle. Systolic and diastolic phases were analyzed on a dedicated work station using MPR, MIP and VRT modes. The patient received OMNIPAQUE IOHEXOL 350 MG/ML SOLN of contrast. FINDINGS: Coronary calcium score: The patient's coronary artery calcium score is 136, which places the patient in the 85th percentile. Coronary arteries: Normal coronary origins.  Right dominance. Right Coronary Artery: Normal caliber vessel, gives rise to PDA. Scattered proximal noncalcified plaque with 1-24% stenosis. Left Main Coronary Artery: Normal caliber vessel. Ostial noncalcified plaque with trivial stenosis. Left Anterior Descending Coronary Artery: Normal caliber vessel. Diffuse mixed calcified and noncalcified plaque throughout the proximal LAD with 25-49% stenosis. Gives rise to three diagonal branches. Left Circumflex Artery: Small caliber vessel. Proximal noncalcified plaque with 1-24% stenosis. Gives rise to one small OM branch. Aorta: Normal size, 30 mm at the mid ascending aorta (level of the PA bifurcation) measured double oblique. No aortic atherosclerosis. No dissection seen in  visualized portions of the aorta. Aortic Valve: No calcifications. Trileaflet. Other findings: Normal pulmonary vein drainage into the left atrium. Normal left atrial appendage without a thrombus. Normal size of the pulmonary artery. Evidence of pulmonary embolism, see read from [facility]. Normal appearance of the pericardium. IMPRESSION: 1. Mild nonobstructive CAD, CADRADS = 2. Diffuse mixed calcified and noncalcified plaque throughout the proximal LAD with 25-49% stenosis. 2. Coronary calcium score of 136. This was 85th percentile for age and sex matched control. 3. Normal coronary origin with right dominance. 4. Evidence of pulmonary embolism, see read from [facility]. Findings communicated to Dr. Izora Ribas. INTERPRETATION: CAD-RADS 2: Mild non-obstructive CAD (25-49%). Consider non-atherosclerotic causes of chest pain. Consider preventive therapy and risk factor modification. Electronically Signed   By: Jodelle Red M.D.   On: 04/04/2022 13:54   Result Date: 04/04/2022 EXAM: OVER-READ INTERPRETATION  CT CHEST The following report is a limited chest CT over-read performed by radiologist Dr. Allegra Lai of [facility] on 04/03/2022. This over-read does not include interpretation of cardiac or coronary anatomy or pathology. The coronary CTA interpretation by the cardiologist is attached. COMPARISON:  CT chest dated November 14, 2021 FINDINGS: Vascular: Normal heart size. No pericardial effusion. Filling defects of the bilateral lobar pulmonary arteries, some of which have a linear configuration. Segmental filling defect of the medial right middle lobe pulmonary artery. Normal caliber thoracic aorta with no significant atherosclerotic disease. Mediastinum/Nodes: No enlarged mediastinal or axillary lymph nodes. Thyroid gland, trachea, and esophagus demonstrate no significant findings. Lungs/Pleura: Central airways are patent. Cavitary masslike opacity of the peripheral  superior portion of the right lower lobe with central ground-glass measuring 4.8 x 2.2 cm on series limited image 6. Additional linear nodular opacities seen in the inferior portion of the right lower lobe on series 2, image 45. Upper Abdomen: No acute abnormality. Musculoskeletal: Old posterior left-sided rib fractures. No chest wall mass or suspicious bone lesions identified. IMPRESSION: 1. Pulmonary embolus is seen in the bilateral lobar pulmonary arteries, many of the filling defects have a linear configuration which is compatible with subacute/chronic pulmonary embolus, however acute on chronic can not be excluded. 2. Cavitary masslike opacity of the peripheral right lower lobe with central ground-glass, likely due to pulmonary infarct, although infection or malignancy  could also have this appearance. Recommend follow-up chest CT in 3 months to ensure resolution. 3. Additional linear nodular opacity is seen in the inferior portion of the right lower lobe, likely due to scarring. Recommend attention on follow-up. Critical Value/emergent results were called by telephone at the time of interpretation on 04/04/2022 at 1:29 pm to provider Dr. Girard Cooteramintz, who verbally acknowledged these results. Electronically Signed: By: Allegra LaiLeah  Strickland M.D. On: 04/04/2022 13:30    All questions were answered. The patient knows to call the clinic with any problems, questions or concerns. I spent 30 minutes in the care of this patient including H and P, review of records, counseling and coordination of care.     Rachel MouldsPraveena Jeriann Sayres, MD 04/18/2022 8:21 AM

## 2022-04-26 ENCOUNTER — Ambulatory Visit (HOSPITAL_COMMUNITY): Payer: Commercial Managed Care - HMO | Attending: Internal Medicine

## 2022-04-26 DIAGNOSIS — I5030 Unspecified diastolic (congestive) heart failure: Secondary | ICD-10-CM | POA: Diagnosis not present

## 2022-04-26 DIAGNOSIS — I2699 Other pulmonary embolism without acute cor pulmonale: Secondary | ICD-10-CM | POA: Insufficient documentation

## 2022-04-26 DIAGNOSIS — I2584 Coronary atherosclerosis due to calcified coronary lesion: Secondary | ICD-10-CM | POA: Insufficient documentation

## 2022-04-26 DIAGNOSIS — I2510 Atherosclerotic heart disease of native coronary artery without angina pectoris: Secondary | ICD-10-CM | POA: Insufficient documentation

## 2022-04-26 LAB — ECHOCARDIOGRAM COMPLETE
Area-P 1/2: 3.56 cm2
S' Lateral: 2.9 cm

## 2022-05-16 ENCOUNTER — Other Ambulatory Visit: Payer: Self-pay | Admitting: Radiology

## 2022-05-16 ENCOUNTER — Encounter: Payer: Self-pay | Admitting: Family Medicine

## 2022-05-16 ENCOUNTER — Ambulatory Visit (INDEPENDENT_AMBULATORY_CARE_PROVIDER_SITE_OTHER): Payer: Commercial Managed Care - HMO | Admitting: Family Medicine

## 2022-05-16 VITALS — BP 122/84 | HR 82 | Temp 97.8°F | Ht 72.0 in | Wt 184.5 lb

## 2022-05-16 DIAGNOSIS — Z720 Tobacco use: Secondary | ICD-10-CM | POA: Diagnosis not present

## 2022-05-16 DIAGNOSIS — E782 Mixed hyperlipidemia: Secondary | ICD-10-CM

## 2022-05-16 DIAGNOSIS — E519 Thiamine deficiency, unspecified: Secondary | ICD-10-CM

## 2022-05-16 DIAGNOSIS — F1090 Alcohol use, unspecified, uncomplicated: Secondary | ICD-10-CM

## 2022-05-16 DIAGNOSIS — I2584 Coronary atherosclerosis due to calcified coronary lesion: Secondary | ICD-10-CM

## 2022-05-16 DIAGNOSIS — L98499 Non-pressure chronic ulcer of skin of other sites with unspecified severity: Secondary | ICD-10-CM | POA: Insufficient documentation

## 2022-05-16 DIAGNOSIS — Z23 Encounter for immunization: Secondary | ICD-10-CM

## 2022-05-16 DIAGNOSIS — Z77090 Contact with and (suspected) exposure to asbestos: Secondary | ICD-10-CM

## 2022-05-16 DIAGNOSIS — I889 Nonspecific lymphadenitis, unspecified: Secondary | ICD-10-CM

## 2022-05-16 DIAGNOSIS — I2699 Other pulmonary embolism without acute cor pulmonale: Secondary | ICD-10-CM

## 2022-05-16 DIAGNOSIS — Z1211 Encounter for screening for malignant neoplasm of colon: Secondary | ICD-10-CM

## 2022-05-16 DIAGNOSIS — I2510 Atherosclerotic heart disease of native coronary artery without angina pectoris: Secondary | ICD-10-CM

## 2022-05-16 LAB — POC URINALSYSI DIPSTICK (AUTOMATED)
Bilirubin, UA: NEGATIVE
Blood, UA: NEGATIVE
Glucose, UA: NEGATIVE
Ketones, UA: NEGATIVE
Leukocytes, UA: NEGATIVE
Nitrite, UA: NEGATIVE
Protein, UA: NEGATIVE
Spec Grav, UA: 1.015 (ref 1.010–1.025)
Urobilinogen, UA: 0.2 E.U./dL
pH, UA: 8 (ref 5.0–8.0)

## 2022-05-16 LAB — FECAL OCCULT BLOOD, IMMUNOCHEMICAL: Fecal Occult Bld: NEGATIVE

## 2022-05-16 MED ORDER — VARENICLINE TARTRATE (STARTER) 0.5 MG X 11 & 1 MG X 42 PO TBPK: ORAL_TABLET | ORAL | 0 refills | Status: DC

## 2022-05-16 MED ORDER — VARENICLINE TARTRATE 1 MG PO TABS: 1.0000 mg | ORAL_TABLET | Freq: Two times a day (BID) | ORAL | 1 refills | Status: DC

## 2022-05-16 MED ORDER — NICOTINE 21 MG/24HR TD PT24: 21.0000 mg | MEDICATED_PATCH | Freq: Every day | TRANSDERMAL | 0 refills | Status: AC

## 2022-05-16 NOTE — Assessment & Plan Note (Signed)
Continued 1+ PPD smoker.  Ready for cessation.  Wellbutrin not effective.  Discussed NRT specifically nicotine patches vs chantix. Pt requests trial of chantix - discussed common side effects including but not limited to nausea/vomiting, vivid dreams, behaviour changes, discussed possible CV risk association.

## 2022-05-16 NOTE — Assessment & Plan Note (Signed)
He's quit beer, continues drinking significant vodka.

## 2022-05-16 NOTE — Assessment & Plan Note (Signed)
He is undergoing lung cancer screening program.

## 2022-05-16 NOTE — Assessment & Plan Note (Addendum)
Has seen cardiology. Now on atorvastatin 40mg  daily

## 2022-05-16 NOTE — Addendum Note (Signed)
Addended by: Brenton Grills on: 70/14/1030 09:48 AM   Modules accepted: Orders

## 2022-05-16 NOTE — Assessment & Plan Note (Signed)
Has seen cardiology. Now on atorvastatin 40mg daily  

## 2022-05-16 NOTE — Patient Instructions (Addendum)
Flu shot today Shingles shot today  Try nicotine patches (nicoderm CQ) over the counter - first month is 21mg  then drop to 14mg  then down to 7mg  each month.  If not doing well with this, price out chantix (prescriptions printed out today).  For swollen gland likely lymph node - check urine today. Let us know if not fully resolved over next 3-4 weeks for further evaluation (labwork).

## 2022-05-16 NOTE — Progress Notes (Signed)
Patient ID: Kirk Bradley, male    DOB: 1966/07/09, 56 y.o.   MRN: [id number]  This visit was conducted in person.  BP 122/84   Pulse 82   Temp 97.8 F (36.6 C) (Temporal)   Ht 6' (1.829 m)   Wt 184 lb 8 oz (83.7 kg)   SpO2 98%   BMI 25.02 kg/m    CC: check lump in groin Subjective:   HPI: Kirk Bradley is a 56 y.o. male presenting on 05/16/2022 for Mass (C/o lump in L groin.  Noticed about 1 wk ago.  Painful but not as much.  Also, wants to discuss quitting smoking. )   1 wk h/o tender lump to left groin.  No fevers/chills, urinary symptoms of dysuria, urethral discharge, abd pain, nausea. No new skin rashes or infection.  No new sexual partners. Monogamous with wife who just had surgery for breast cancer.  Occ night sweats. No weight changes.   Smoker - 1-1.5 ppd smoker. Undergoing lung cancer screening. Wellbutrin previously didn't help. Has tried NRT - lozenges, nicotine dip. Has not tried chantix.   Since last seen, has seen cardiology and hematology for chest pains and eosinophilia. Incidentally found to have bilateral lobar pulmonary emboli with cavitary masslike opacity of peripheral right lower lobe thought due to pulmonary infarct, rec f/u CT scan in 3 months. Started on eliquis, planned 3-6 months then undergoing hypercoagulable workup given mother also with h/o PE. Echocardiogram reassuring 03/2022. Coronary calcium score 136, now on atorvastatin.   He quit beer, just drinking vodka (1 bottle or 732mL/wk) Cut down on MJ.      Relevant past medical, surgical, family and social history reviewed and updated as indicated. Interim medical history since our last visit reviewed. Allergies and medications reviewed and updated. Outpatient Medications Prior to Visit  Medication Sig Dispense Refill   acetaminophen (TYLENOL) 500 MG tablet Take 500 mg by mouth in the morning.     apixaban (ELIQUIS) 5 MG TABS tablet Take 1 tablet (5 mg total) by mouth 2 (two) times  daily. 180 tablet 3   atorvastatin (LIPITOR) 40 MG tablet Take 1 tablet (40 mg total) by mouth daily. 90 tablet 3   cyclobenzaprine (FLEXERIL) 10 MG tablet Take 0.5-1 tablets (5-10 mg total) by mouth 2 (two) times daily as needed for muscle spasms. 30 tablet 3   gabapentin (NEURONTIN) 300 MG capsule Take 1 capsule (300 mg total) by mouth daily. 90 capsule 3   omeprazole (PRILOSEC) 20 MG capsule Take 1 capsule (20 mg total) by mouth daily. 90 capsule 3   Thiamine HCl (B-1) 100 MG TABS Take 1 tablet (100 mg total) by mouth every Monday, Wednesday, and Friday.     No facility-administered medications prior to visit.     Per HPI unless specifically indicated in ROS section below Review of Systems  Objective:  BP 122/84   Pulse 82   Temp 97.8 F (36.6 C) (Temporal)   Ht 6' (1.829 m)   Wt 184 lb 8 oz (83.7 kg)   SpO2 98%   BMI 25.02 kg/m   Wt Readings from Last 3 Encounters:  05/16/22 184 lb 8 oz (83.7 kg)  04/18/22 176 lb 9.6 oz (80.1 kg)  04/11/22 187 lb 6.4 oz (85 kg)      Physical Exam Vitals and nursing note reviewed.  Constitutional:      Appearance: Normal appearance. He is not ill-appearing.  Cardiovascular:     Rate and Rhythm: Normal rate and  regular rhythm.     Pulses: Normal pulses.     Heart sounds: Normal heart sounds. No murmur heard. Pulmonary:     Effort: Pulmonary effort is normal. No respiratory distress.     Breath sounds: Normal breath sounds. No wheezing, rhonchi or rales.  Abdominal:     General: Abdomen is flat. Bowel sounds are normal. There is no distension.     Palpations: Abdomen is soft. There is no mass.     Tenderness: There is no abdominal tenderness. There is no guarding or rebound.     Hernia: No hernia is present. There is no hernia in the left inguinal area or right inguinal area.  Genitourinary:    Pubic Area: No rash.      Penis: Normal. No discharge or lesions.      Testes: Normal.        Right: Mass, tenderness or swelling not  present. Right testis is descended.        Left: Mass, tenderness or swelling not present. Left testis is descended.    Lymphadenopathy:     Head:     Right side of head: No submental, submandibular, tonsillar, preauricular or posterior auricular adenopathy.     Left side of head: No submental, submandibular, tonsillar, preauricular or posterior auricular adenopathy.     Cervical: No cervical adenopathy.     Upper Body:     Right upper body: No supraclavicular or axillary adenopathy.     Left upper body: No supraclavicular or axillary adenopathy.     Lower Body: No right inguinal adenopathy. Left inguinal adenopathy (subcm) present.  Skin:    General: Skin is warm and dry.     Findings: No rash.  Neurological:     Mental Status: He is alert.  Psychiatric:        Mood and Affect: Mood normal.        Behavior: Behavior normal.       Results for orders placed or performed in visit on 04/26/22  ECHOCARDIOGRAM COMPLETE  Result Value Ref Range   Area-P 1/2 3.56 cm2   S' Lateral 2.90 cm    Assessment & Plan:   Problem List Items Addressed This Visit     Habitual alcohol use    He's quit beer, continues drinking significant vodka.       Tobacco abuse    Continued 1+ PPD smoker.  Ready for cessation.  Wellbutrin not effective.  Discussed NRT specifically nicotine patches vs chantix. Pt requests trial of chantix - discussed common side effects including but not limited to nausea/vomiting, vivid dreams, behaviour changes, discussed possible CV risk association.      Asbestos exposure    He is undergoing lung cancer screening program.       Vitamin B1 deficiency    Now on b1 thiamine 100mg  MWF      Coronary artery calcification    Has seen cardiology. Now on atorvastatin 40mg  daily       Mixed hyperlipidemia    Has seen cardiology. 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       Inguinal lymphadenitis - Primary    Left sided, seems to be improving over the past week.  No signs of infection.  No other lymphadenopathy appreciated. Check UA today. Advised let us know if ongoing LAD past 3-4 wks for further eval with labwork including RPR.  Other Visit Diagnoses     Need for influenza vaccination       Relevant Orders   Flu Vaccine QUAD 33mo+IM (Fluarix, Fluzone & Alfiuria Quad PF) (Completed)   Need for shingles vaccine       Relevant Orders   Varicella-zoster vaccine IM (Completed)        Meds ordered this encounter  Medications   varenicline (CHANTIX CONTINUING MONTH PAK) 1 MG tablet    Sig: Take 1 tablet (1 mg total) by mouth 2 (two) times daily.    Dispense:  60 tablet    Refill:  1   Varenicline Tartrate, Starter, (CHANTIX STARTING MONTH PAK) 0.5 MG X 11 & 1 MG X 42 TBPK    Sig: Use as directed    Dispense:  53 each    Refill:  0   nicotine (NICODERM CQ) 21 mg/24hr patch    Sig: Place 1 patch (21 mg total) onto the skin daily.    Dispense:  28 patch    Refill:  0   Orders Placed This Encounter  Procedures   Flu Vaccine QUAD 55mo+IM (Fluarix, Fluzone & Alfiuria Quad PF)   Varicella-zoster vaccine IM     Patient Instructions  Flu shot today Shingles shot today  Try nicotine patches (nicoderm CQ) over the counter - first month is 21mg  then drop to 14mg  then down to 7mg  each month.  If not doing well with this, price out chantix (prescriptions printed out today).  For swollen gland likely lymph node - check urine today. Let us know if not fully resolved over next 3-4 weeks for further evaluation (labwork).   Follow up plan: Return if symptoms worsen or fail to improve.  Ria Bush, MD

## 2022-05-16 NOTE — Assessment & Plan Note (Signed)
Left sided, seems to be improving over the past week.  No signs of infection.  No other lymphadenopathy appreciated. Check UA today. Advised let us know if ongoing LAD past 3-4 wks for further eval with labwork including RPR.

## 2022-05-16 NOTE — Assessment & Plan Note (Signed)
Incidental finding 03/2022, started eliquis, sees hematology.

## 2022-05-16 NOTE — Assessment & Plan Note (Signed)
Now on b1 thiamine 100mg  MWF

## 2022-05-24 ENCOUNTER — Ambulatory Visit: Payer: 59

## 2022-06-12 IMAGING — DX DG FINGER THUMB 2+V*R*
3 series · 3 of 3 positions shown · non-contrast
Comparison: None.

CLINICAL DATA: History of laceration, evaluate for foreign body

EXAM:
RIGHT THUMB 3V

[thumb ap]
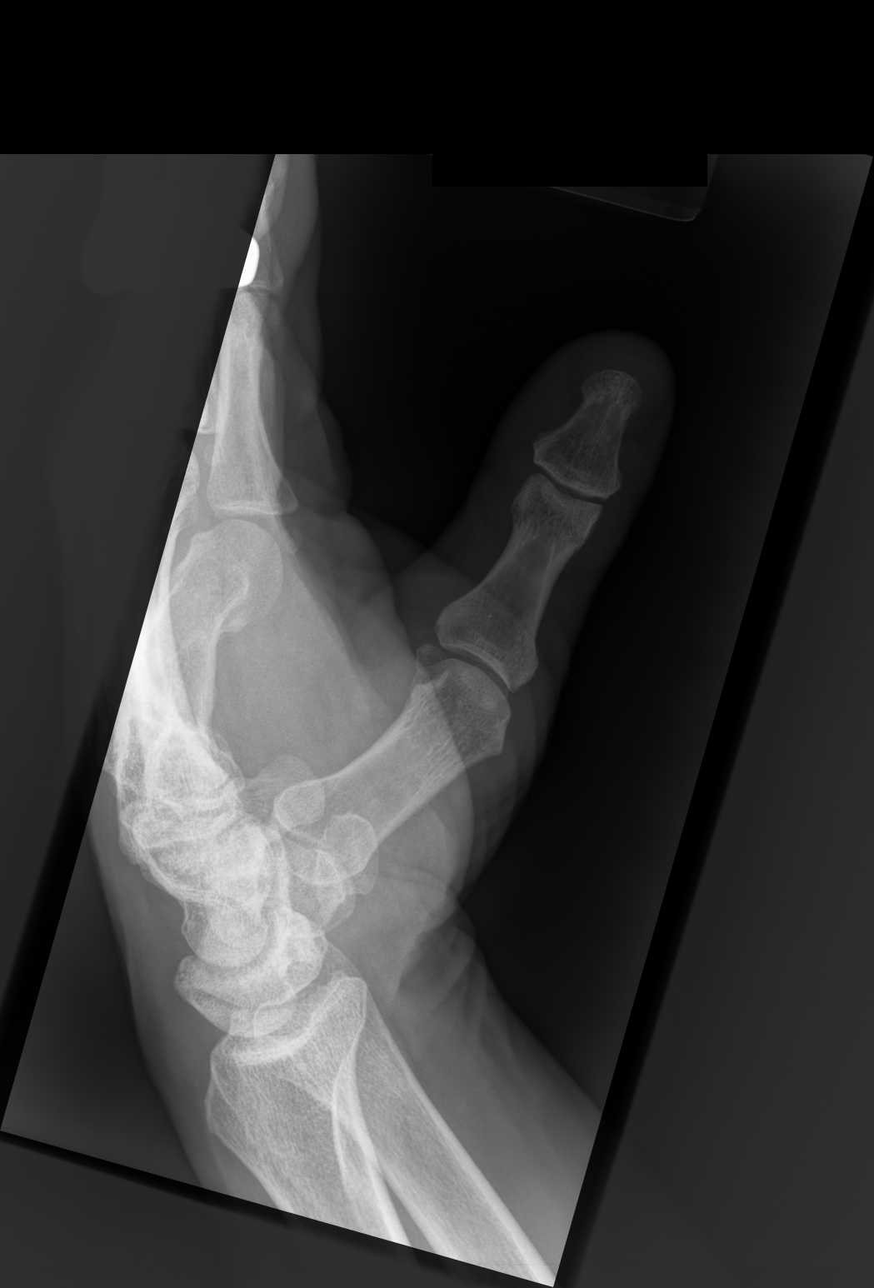

[thumb lat]
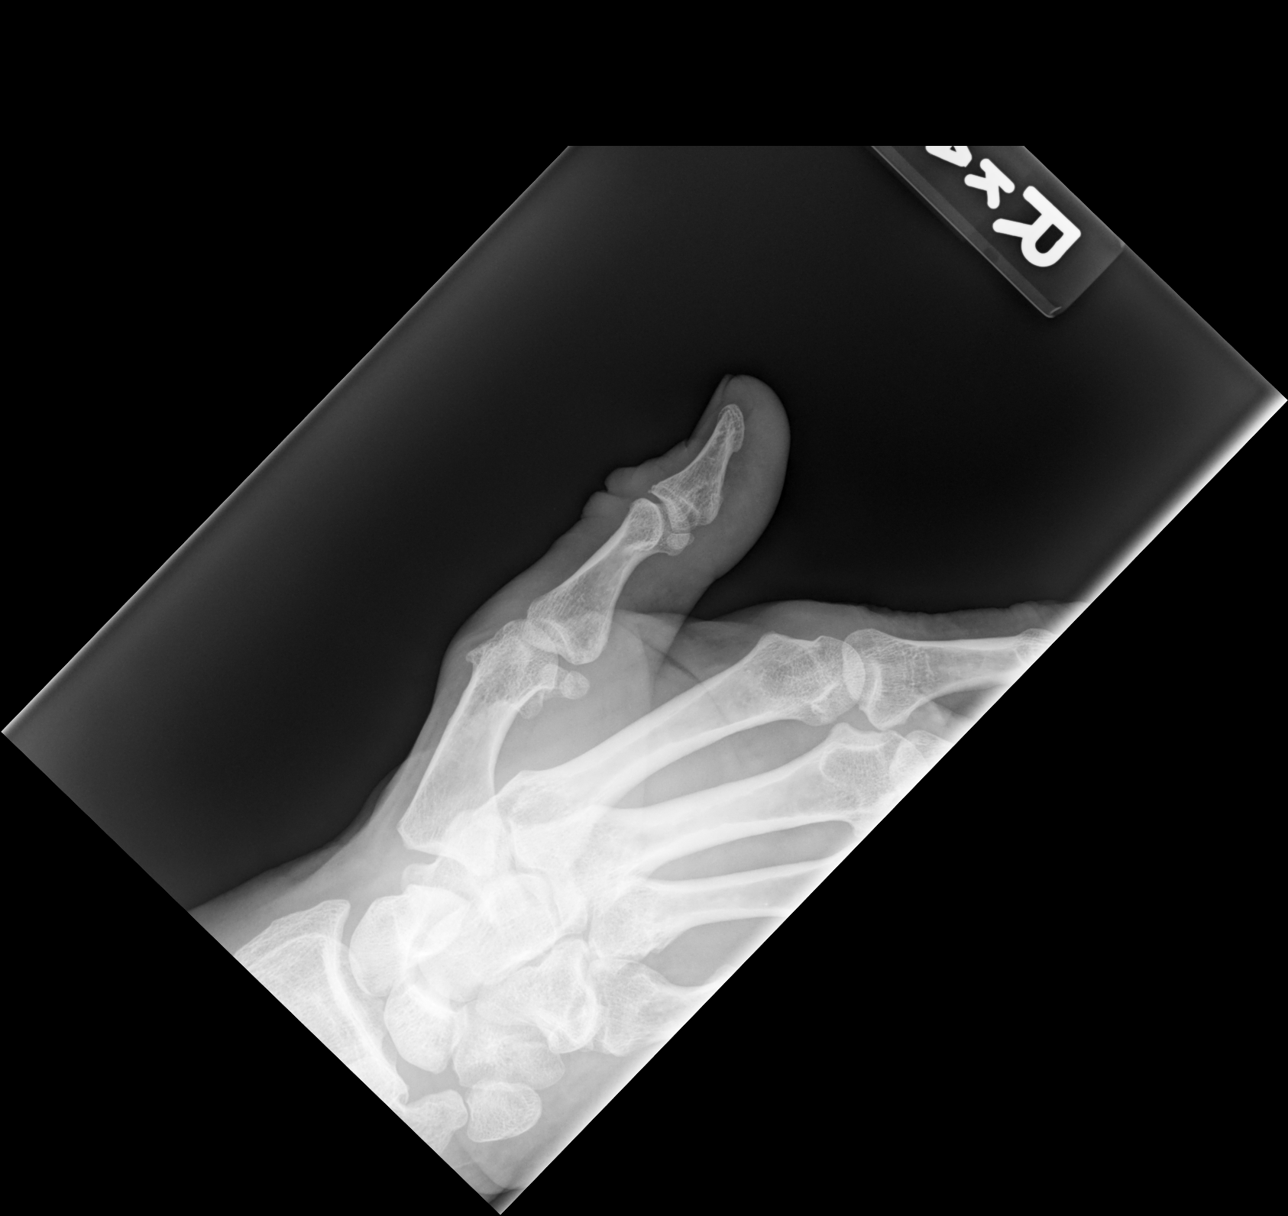

[thumb mlo]
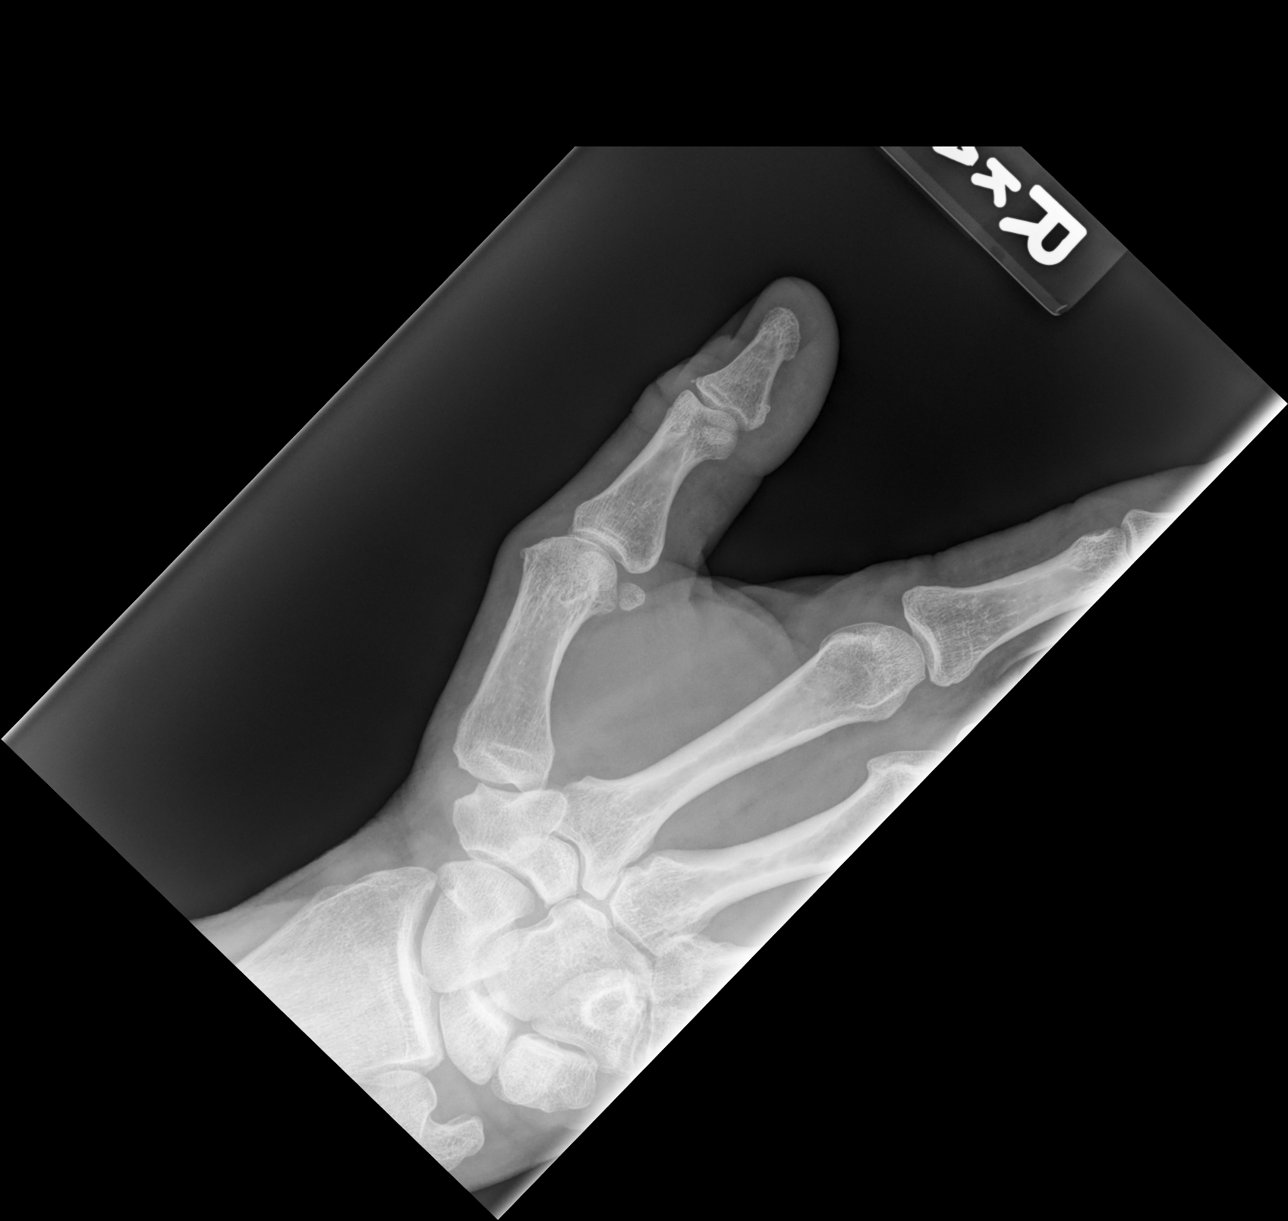

[3 of 3 positions shown; findings below may reference images not displayed]

FINDINGS: There is no evidence of fracture or dislocation. There is no
evidence of significant arthropathy or other focal bone abnormality.
Soft tissues are unremarkable, no evidence of radiopaque foreign
body.
IMPRESSION: No evidence of radiopaque foreign body.

## 2022-07-04 ENCOUNTER — Ambulatory Visit (HOSPITAL_BASED_OUTPATIENT_CLINIC_OR_DEPARTMENT_OTHER): Admission: RE | Admit: 2022-07-04 | Payer: Commercial Managed Care - HMO | Source: Ambulatory Visit

## 2022-07-11 ENCOUNTER — Ambulatory Visit: Payer: Commercial Managed Care - HMO | Admitting: Internal Medicine

## 2022-07-24 ENCOUNTER — Other Ambulatory Visit: Payer: Self-pay

## 2022-07-24 DIAGNOSIS — I2699 Other pulmonary embolism without acute cor pulmonale: Secondary | ICD-10-CM

## 2022-08-01 ENCOUNTER — Ambulatory Visit (HOSPITAL_COMMUNITY): Payer: Commercial Managed Care - HMO

## 2022-08-11 ENCOUNTER — Other Ambulatory Visit: Payer: Self-pay | Admitting: Family Medicine

## 2022-08-11 DIAGNOSIS — E519 Thiamine deficiency, unspecified: Secondary | ICD-10-CM

## 2022-08-11 DIAGNOSIS — Z125 Encounter for screening for malignant neoplasm of prostate: Secondary | ICD-10-CM

## 2022-08-11 DIAGNOSIS — D721 Eosinophilia, unspecified: Secondary | ICD-10-CM

## 2022-08-11 DIAGNOSIS — F1090 Alcohol use, unspecified, uncomplicated: Secondary | ICD-10-CM

## 2022-08-11 DIAGNOSIS — E782 Mixed hyperlipidemia: Secondary | ICD-10-CM

## 2022-08-17 ENCOUNTER — Other Ambulatory Visit: Payer: 59

## 2022-08-24 ENCOUNTER — Encounter: Payer: 59 | Admitting: Family Medicine

## 2022-08-29 ENCOUNTER — Other Ambulatory Visit: Payer: Self-pay | Admitting: Family Medicine

## 2022-08-29 NOTE — Telephone Encounter (Signed)
Gabapentin Last filled: 07/20/22, #90 Last OV: 05/16/22, mass in groin Next OV: none (pt has c/x several appts)

## 2022-09-03 ENCOUNTER — Other Ambulatory Visit: Payer: 59

## 2022-09-10 ENCOUNTER — Inpatient Hospital Stay: Payer: 59 | Attending: Hematology and Oncology | Admitting: Hematology and Oncology

## 2022-09-10 NOTE — Progress Notes (Deleted)
[name] CONSULT NOTE  Patient Care Team: Ria Bush, MD as PCP - General (Family Medicine) Werner Lean, MD as PCP - Cardiology (Cardiology)  CHIEF COMPLAINTS/PURPOSE OF CONSULTATION:  [name], pulmonary embolism and infarction  ASSESSMENT & PLAN:   This is a pleasant 57 year old male patient with past medical history significant for heavy alcohol ingestion who recently presented to his PCP with worsening fatigue, sleeping almost 14 hours a day and some random chest pains.  He was then diagnosed with PE and currently continues on anticoagulation.  Thank you for consulting [place] the care of this patient.   Please do not hesitate contact us with any additional questions or concerns.  HISTORY OF PRESENTING ILLNESS:   Kirk Bradley 57 y.o. male is here because of eosinophilia.  Interval history He initially was referred to [place] with eosinophilia which has resolved.  Since his last visit however he was diagnosed with PE noted on a cardiac CT evaluation and he was started on Eliquis and he was sent back to discuss anticoagulation recommendations.  He continues to have the random chest pain without a clear aggravating or relieving factor.   He is very compliant with Eliquis and denies any issues with bleeding.   Rest of the pertinent 10 point ROS reviewed and negative  MEDICAL HISTORY:  Past Medical History:  Diagnosis Date   Alcohol abuse    GERD (gastroesophageal reflux disease)    Osteoarthritis    Smoker     SURGICAL HISTORY: Past Surgical History:  Procedure Laterality Date   NO PAST SURGERIES      SOCIAL HISTORY: Social History   Socioeconomic History   Marital status: Married    Spouse name: Not on file   Number of children: Not on file   Years of education: Not on file   Highest education level: Not on file  Occupational History   Not on file  Tobacco Use   Smoking status: Every Day    Packs/day: 1.50    Years: 34.00     Total pack years: 51.00    Types: Cigarettes    Start date: 07/30/1986   Smokeless tobacco: Never   Tobacco comments:    1-2 ppd  Substance and Sexual Activity   Alcohol use: Yes    Alcohol/week: 0.0 standard drinks of alcohol    Comment: 12-18 pack beer/day   Drug use: Yes    Comment: MJ - occasional   Sexual activity: Not on file  Other Topics Concern   Not on file  Social History Narrative   Lives with wife and 8 dogs   Occupation: works in Architect   Edu: GED   Activity: very active at work   Diet: good water, fruits/vegetables daily   Social Determinants of Radio broadcast assistant Strain: Not on Art therapist Insecurity: Not on file  Transportation Needs: Not on file  Physical Activity: Not on file  Stress: Not on file  Social Connections: Not on file  Intimate Partner Violence: Not on file    FAMILY HISTORY: Family History  Problem Relation Age of Onset   Rheum arthritis Mother    Pulmonary embolism Mother    Alcoholism Father    Alcoholism Maternal Grandfather    Alcoholism Maternal Aunt    Alcoholism Maternal Uncle    Cancer Maternal Uncle        unsure, agent orange exposure in [place]   CAD Neg Hx    Diabetes Neg Hx  ALLERGIES:  has No Known Allergies.  MEDICATIONS:  Current Outpatient Medications  Medication Sig Dispense Refill   acetaminophen (TYLENOL) 500 MG tablet Take 500 mg by mouth in the morning.     apixaban (ELIQUIS) 5 MG TABS tablet Take 1 tablet (5 mg total) by mouth 2 (two) times daily. 180 tablet 3   atorvastatin (LIPITOR) 40 MG tablet Take 1 tablet (40 mg total) by mouth daily. 90 tablet 3   cyclobenzaprine (FLEXERIL) 10 MG tablet Take 0.5-1 tablets (5-10 mg total) by mouth 2 (two) times daily as needed for muscle spasms. 30 tablet 3   gabapentin (NEURONTIN) 300 MG capsule TAKE 1 CAPSULE BY MOUTH EVERY DAY 30 capsule 5   nicotine (NICODERM CQ) 21 mg/24hr patch Place 1 patch (21 mg total) onto the skin daily. 28 patch 0    omeprazole (PRILOSEC) 20 MG capsule Take 1 capsule (20 mg total) by mouth daily. 90 capsule 3   Thiamine HCl (B-1) 100 MG TABS Take 1 tablet (100 mg total) by mouth every Monday, Wednesday, and Friday.     varenicline (CHANTIX CONTINUING MONTH PAK) 1 MG tablet Take 1 tablet (1 mg total) by mouth 2 (two) times daily. 60 tablet 1   Varenicline Tartrate, Starter, (CHANTIX STARTING MONTH PAK) 0.5 MG X 11 & 1 MG X 42 TBPK Use as directed 53 each 0   No current facility-administered medications for this visit.     PHYSICAL EXAMINATION: ECOG PERFORMANCE STATUS: 0 - Asymptomatic  There were no vitals filed for this visit.  There were no vitals filed for this visit.   GENERAL:alert, no distress and comfortable LUNGS: clear to auscultation and percussion with normal breathing effort HEART: regular rate & rhythm and no murmurs and no lower extremity edema  LABORATORY DATA:  I have reviewed the data as listed Lab Results  Component Value Date   WBC 8.0 03/15/2022   HGB 13.5 03/15/2022   HCT 39.5 03/15/2022   MCV 96.6 03/15/2022   PLT 213 03/15/2022     Chemistry      Component Value Date/Time   NA 140 03/15/2022 1539   NA 139 03/12/2022 1444   K 4.4 03/15/2022 1539   CL 108 03/15/2022 1539   CO2 28 03/15/2022 1539   BUN 16 03/15/2022 1539   BUN 15 03/12/2022 1444   CREATININE 1.09 03/15/2022 1539      Component Value Date/Time   CALCIUM 9.0 03/15/2022 1539   ALKPHOS 63 03/15/2022 1539   AST 27 03/15/2022 1539   ALT 26 03/15/2022 1539   BILITOT 0.5 03/15/2022 1539       RADIOGRAPHIC STUDIES: I have personally reviewed the radiological images as listed and agreed with the findings in the report. No results found.  All questions were answered. The patient knows to call the clinic with any problems, questions or concerns. I spent 30 minutes in the care of this patient including H and P, review of records, counseling and coordination of care.     Benay Pike,  MD 09/10/2022 1:32 PM

## 2022-09-13 ENCOUNTER — Other Ambulatory Visit: Payer: Self-pay | Admitting: Family Medicine

## 2022-09-13 DIAGNOSIS — K219 Gastro-esophageal reflux disease without esophagitis: Secondary | ICD-10-CM

## 2022-09-21 ENCOUNTER — Other Ambulatory Visit: Payer: Self-pay

## 2022-09-24 ENCOUNTER — Other Ambulatory Visit (INDEPENDENT_AMBULATORY_CARE_PROVIDER_SITE_OTHER): Payer: Self-pay

## 2022-09-24 DIAGNOSIS — E519 Thiamine deficiency, unspecified: Secondary | ICD-10-CM

## 2022-09-24 DIAGNOSIS — F1090 Alcohol use, unspecified, uncomplicated: Secondary | ICD-10-CM

## 2022-09-24 DIAGNOSIS — D721 Eosinophilia, unspecified: Secondary | ICD-10-CM

## 2022-09-24 DIAGNOSIS — E782 Mixed hyperlipidemia: Secondary | ICD-10-CM

## 2022-09-24 DIAGNOSIS — Z125 Encounter for screening for malignant neoplasm of prostate: Secondary | ICD-10-CM

## 2022-09-25 ENCOUNTER — Other Ambulatory Visit: Payer: Self-pay | Admitting: Family Medicine

## 2022-09-25 ENCOUNTER — Telehealth: Payer: Self-pay

## 2022-09-25 DIAGNOSIS — E782 Mixed hyperlipidemia: Secondary | ICD-10-CM

## 2022-09-25 DIAGNOSIS — Z720 Tobacco use: Secondary | ICD-10-CM

## 2022-09-25 DIAGNOSIS — I889 Nonspecific lymphadenitis, unspecified: Secondary | ICD-10-CM

## 2022-09-25 DIAGNOSIS — F1090 Alcohol use, unspecified, uncomplicated: Secondary | ICD-10-CM

## 2022-09-25 DIAGNOSIS — D721 Eosinophilia, unspecified: Secondary | ICD-10-CM

## 2022-09-25 DIAGNOSIS — Z125 Encounter for screening for malignant neoplasm of prostate: Secondary | ICD-10-CM

## 2022-09-25 NOTE — Telephone Encounter (Signed)
Left vmail for patient to call back to schedule lab redraw.  He will need to be fasting for this appt.  Karna Christmas has more info, if needed.

## 2022-09-27 LAB — VITAMIN B1: Vitamin B1 (Thiamine): 43 nmol/L — ABNORMAL HIGH (ref 8–30)

## 2022-09-28 ENCOUNTER — Telehealth: Payer: Self-pay

## 2022-09-28 ENCOUNTER — Ambulatory Visit (INDEPENDENT_AMBULATORY_CARE_PROVIDER_SITE_OTHER): Payer: 59 | Admitting: Family Medicine

## 2022-09-28 ENCOUNTER — Encounter: Payer: Self-pay | Admitting: Hematology and Oncology

## 2022-09-28 ENCOUNTER — Encounter: Payer: Self-pay | Admitting: Family Medicine

## 2022-09-28 VITALS — BP 124/82 | HR 77 | Temp 97.6°F | Ht 70.5 in | Wt 187.4 lb

## 2022-09-28 DIAGNOSIS — Z0001 Encounter for general adult medical examination with abnormal findings: Secondary | ICD-10-CM

## 2022-09-28 DIAGNOSIS — R079 Chest pain, unspecified: Secondary | ICD-10-CM

## 2022-09-28 DIAGNOSIS — Z125 Encounter for screening for malignant neoplasm of prostate: Secondary | ICD-10-CM

## 2022-09-28 DIAGNOSIS — E782 Mixed hyperlipidemia: Secondary | ICD-10-CM | POA: Diagnosis not present

## 2022-09-28 DIAGNOSIS — L98491 Non-pressure chronic ulcer of skin of other sites limited to breakdown of skin: Secondary | ICD-10-CM

## 2022-09-28 DIAGNOSIS — K219 Gastro-esophageal reflux disease without esophagitis: Secondary | ICD-10-CM | POA: Diagnosis not present

## 2022-09-28 DIAGNOSIS — D721 Eosinophilia, unspecified: Secondary | ICD-10-CM

## 2022-09-28 DIAGNOSIS — J432 Centrilobular emphysema: Secondary | ICD-10-CM

## 2022-09-28 DIAGNOSIS — F1090 Alcohol use, unspecified, uncomplicated: Secondary | ICD-10-CM

## 2022-09-28 DIAGNOSIS — Z77090 Contact with and (suspected) exposure to asbestos: Secondary | ICD-10-CM

## 2022-09-28 DIAGNOSIS — Z720 Tobacco use: Secondary | ICD-10-CM

## 2022-09-28 DIAGNOSIS — E519 Thiamine deficiency, unspecified: Secondary | ICD-10-CM

## 2022-09-28 DIAGNOSIS — Z114 Encounter for screening for human immunodeficiency virus [HIV]: Secondary | ICD-10-CM

## 2022-09-28 DIAGNOSIS — I2699 Other pulmonary embolism without acute cor pulmonale: Secondary | ICD-10-CM

## 2022-09-28 LAB — CBC WITH DIFFERENTIAL/PLATELET
Basophils Absolute: 0.1 10*3/uL (ref 0.0–0.1)
Basophils Relative: 1 % (ref 0.0–3.0)
Eosinophils Absolute: 0.1 10*3/uL (ref 0.0–0.7)
Eosinophils Relative: 2.3 % (ref 0.0–5.0)
HCT: 43.3 % (ref 39.0–52.0)
Hemoglobin: 14.7 g/dL (ref 13.0–17.0)
Lymphocytes Relative: 34.7 % (ref 12.0–46.0)
Lymphs Abs: 1.9 10*3/uL (ref 0.7–4.0)
MCHC: 33.9 g/dL (ref 30.0–36.0)
MCV: 100.7 fl — ABNORMAL HIGH (ref 78.0–100.0)
Monocytes Absolute: 0.6 10*3/uL (ref 0.1–1.0)
Monocytes Relative: 10.2 % (ref 3.0–12.0)
Neutro Abs: 2.8 10*3/uL (ref 1.4–7.7)
Neutrophils Relative %: 51.8 % (ref 43.0–77.0)
Platelets: 260 10*3/uL (ref 150.0–400.0)
RBC: 4.3 Mil/uL (ref 4.22–5.81)
RDW: 12.9 % (ref 11.5–15.5)
WBC: 5.4 10*3/uL (ref 4.0–10.5)

## 2022-09-28 LAB — VITAMIN B12: Vitamin B-12: 932 pg/mL — ABNORMAL HIGH (ref 211–911)

## 2022-09-28 LAB — COMPREHENSIVE METABOLIC PANEL
ALT: 24 U/L (ref 0–53)
AST: 28 U/L (ref 0–37)
Albumin: 4.3 g/dL (ref 3.5–5.2)
Alkaline Phosphatase: 61 U/L (ref 39–117)
BUN: 12 mg/dL (ref 6–23)
CO2: 30 mEq/L (ref 19–32)
Calcium: 9.7 mg/dL (ref 8.4–10.5)
Chloride: 103 mEq/L (ref 96–112)
Creatinine, Ser: 0.97 mg/dL (ref 0.40–1.50)
GFR: 88.39 mL/min (ref >60.00)
Glucose, Bld: 81 mg/dL (ref 70–99)
Potassium: 4.8 mEq/L (ref 3.5–5.1)
Sodium: 140 mEq/L (ref 135–145)
Total Bilirubin: 0.6 mg/dL (ref 0.2–1.2)
Total Protein: 6.9 g/dL (ref 6.0–8.3)

## 2022-09-28 LAB — LIPID PANEL
Cholesterol: 155 mg/dL (ref 0–200)
HDL: 46.6 mg/dL (ref >39.00)
LDL Cholesterol: 85 mg/dL (ref 0–99)
NonHDL: 108.1
Total CHOL/HDL Ratio: 3
Triglycerides: 114 mg/dL (ref 0.0–149.0)
VLDL: 22.8 mg/dL (ref 0.0–40.0)

## 2022-09-28 LAB — PSA: PSA: 1.11 ng/mL (ref 0.10–4.00)

## 2022-09-28 LAB — FOLATE: Folate: 19.6 ng/mL (ref >5.9)

## 2022-09-28 MED ORDER — GABAPENTIN 300 MG PO CAPS: 300.0000 mg | ORAL_CAPSULE | Freq: Every day | ORAL | 4 refills | Status: AC

## 2022-09-28 MED ORDER — ATORVASTATIN CALCIUM 40 MG PO TABS: 40.0000 mg | ORAL_TABLET | Freq: Every day | ORAL | 4 refills | Status: DC

## 2022-09-28 MED ORDER — B-1 100 MG PO TABS: 1.0000 | ORAL_TABLET | ORAL | Status: AC

## 2022-09-28 MED ORDER — OMEPRAZOLE 20 MG PO CPDR: 20.0000 mg | DELAYED_RELEASE_CAPSULE | Freq: Every day | ORAL | 4 refills | Status: DC

## 2022-09-28 NOTE — Progress Notes (Signed)
Patient ID: Kirk Bradley, male    DOB: [birth year], 57 y.o.   MRN: [id number]  This visit was conducted in person.  BP 124/82   Pulse 77   Temp 97.6 F (36.4 C) (Temporal)   Ht 5' 10.5" (1.791 m)   Wt 187 lb 6 oz (85 kg)   SpO2 97%   BMI 26.51 kg/m    CC: CPE Subjective:   HPI: Kirk Bradley is a 57 y.o. male presenting on 09/28/2022 for Annual Exam   He is fasting today - but did have a few sips of orange juice.   Found to have bilateral lobar PEs with likely pulm infarct to RLL treated with eliquis blood thinner.  Missed rpt CTA - was unaware of appt.   Notes left substernal chest discomfort in evenings, as well as during the day. This is not really exertional. No dyspnea or left shoulder pain or jaw pain or nausea.   Notes persistent L inguinal lesion present since 04/2022.   Preventative: Colon cancer screening - iFOB negative 04/2022 Prostate cancer - yearly PSA  Lung cancer screening - undergoing, first one 04/2020, latest 10/2021.  Flu shot - declines COVID vaccine - Pfizer 10/29/2019 x2, booster 06/2020 Td - 06/2010  shingrix - 01/2022, 04/2022 Seat belt use discussed  Sunscreen use discussed. No changing moles on skin.  Smoker - 1-2 ppd, since age 6yo - no cigarette in 5 months! He is now smoking 3-5 (black and mild) cigars/day. Did not use chantix.  Alcohol - to a few mixed drinks or a few beers during the week - no longer keeping alcohol at home.  Rec drugs - MJ - continues cutting down Dentist yearly  Eye exam - overdue  Lives with wife and 8 dogs Occupation: works in Architect Edu: GED Activity: very active at work Diet: good water, fruits/vegetables daily     Relevant past medical, surgical, family and social history reviewed and updated as indicated. Interim medical history since our last visit reviewed. Allergies and medications reviewed and updated. Outpatient Medications Prior to Visit  Medication Sig Dispense Refill   acetaminophen  (TYLENOL) 500 MG tablet Take 500 mg by mouth in the morning.     apixaban (ELIQUIS) 5 MG TABS tablet Take 1 tablet (5 mg total) by mouth 2 (two) times daily. 180 tablet 3   cyclobenzaprine (FLEXERIL) 10 MG tablet Take 0.5-1 tablets (5-10 mg total) by mouth 2 (two) times daily as needed for muscle spasms. 30 tablet 3   nicotine (NICODERM CQ) 21 mg/24hr patch Place 1 patch (21 mg total) onto the skin daily. 28 patch 0   atorvastatin (LIPITOR) 40 MG tablet Take 1 tablet (40 mg total) by mouth daily. 90 tablet 3   gabapentin (NEURONTIN) 300 MG capsule TAKE 1 CAPSULE BY MOUTH EVERY DAY 30 capsule 5   omeprazole (PRILOSEC) 20 MG capsule TAKE 1 CAPSULE BY MOUTH EVERY DAY 30 capsule 0   Thiamine HCl (B-1) 100 MG TABS Take 1 tablet (100 mg total) by mouth every Monday, Wednesday, and Friday.     varenicline (CHANTIX CONTINUING MONTH PAK) 1 MG tablet Take 1 tablet (1 mg total) by mouth 2 (two) times daily. 60 tablet 1   Varenicline Tartrate, Starter, (CHANTIX STARTING MONTH PAK) 0.5 MG X 11 & 1 MG X 42 TBPK Use as directed 53 each 0   No facility-administered medications prior to visit.     Per HPI unless specifically indicated in ROS section below Review of Systems  Constitutional:  Negative for activity change, appetite change, chills, fatigue, fever and unexpected weight change.  HENT:  Negative for hearing loss.   Eyes:  Negative for visual disturbance.  Respiratory:  Negative for cough, chest tightness, shortness of breath and wheezing.   Cardiovascular:  Positive for chest pain (see HPI). Negative for palpitations and leg swelling.  Gastrointestinal:  Negative for abdominal distention, abdominal pain, blood in stool, constipation, diarrhea, nausea and vomiting.  Genitourinary:  Negative for difficulty urinating and hematuria.  Musculoskeletal:  Negative for arthralgias, myalgias and neck pain.  Skin:  Negative for rash.  Neurological:  Negative for dizziness, seizures, syncope and headaches.   Hematological:  Negative for adenopathy. Does not bruise/bleed easily.  Psychiatric/Behavioral:  Negative for dysphoric mood. The patient is not nervous/anxious.     Objective:  BP 124/82   Pulse 77   Temp 97.6 F (36.4 C) (Temporal)   Ht 5' 10.5" (1.791 m)   Wt 187 lb 6 oz (85 kg)   SpO2 97%   BMI 26.51 kg/m   Wt Readings from Last 3 Encounters:  09/28/22 187 lb 6 oz (85 kg)  05/16/22 184 lb 8 oz (83.7 kg)  04/18/22 176 lb 9.6 oz (80.1 kg)      Physical Exam Vitals and nursing note reviewed.  Constitutional:      General: He is not in acute distress.    Appearance: Normal appearance. He is well-developed. He is not ill-appearing.  HENT:     Head: Normocephalic and atraumatic.     Right Ear: Hearing, tympanic membrane, ear canal and external ear normal.     Left Ear: Hearing, tympanic membrane, ear canal and external ear normal.     Mouth/Throat:     Mouth: Mucous membranes are moist.     Pharynx: Oropharynx is clear. No oropharyngeal exudate or posterior oropharyngeal erythema.  Eyes:     General: No scleral icterus.    Extraocular Movements: Extraocular movements intact.     Conjunctiva/sclera: Conjunctivae normal.     Pupils: Pupils are equal, round, and reactive to light.  Neck:     Thyroid: No thyroid mass or thyromegaly.  Cardiovascular:     Rate and Rhythm: Normal rate and regular rhythm.     Pulses: Normal pulses.          Radial pulses are 2+ on the right side and 2+ on the left side.     Heart sounds: Normal heart sounds. No murmur heard. Pulmonary:     Effort: Pulmonary effort is normal. No respiratory distress.     Breath sounds: Normal breath sounds. No wheezing, rhonchi or rales.  Abdominal:     General: Bowel sounds are normal. There is no distension.     Palpations: Abdomen is soft. There is no mass.     Tenderness: There is no abdominal tenderness. There is no guarding or rebound.     Hernia: No hernia is present. There is no hernia in the left  inguinal area or right inguinal area.  Genitourinary:      Comments: Small chronic dry ulcer to L groin limited to breakdown of skin Musculoskeletal:        General: Normal range of motion.     Cervical back: Normal range of motion and neck supple.     Right lower leg: No edema.     Left lower leg: No edema.  Lymphadenopathy:     Cervical: No cervical adenopathy.     Lower Body: No right  inguinal adenopathy. No left inguinal adenopathy.  Skin:    General: Skin is warm and dry.     Findings: No rash.  Neurological:     General: No focal deficit present.     Mental Status: He is alert and oriented to person, place, and time.  Psychiatric:        Mood and Affect: Mood normal.        Behavior: Behavior normal.        Thought Content: Thought content normal.        Judgment: Judgment normal.       Results for orders placed or performed in visit on 09/24/22  Vitamin B1  Result Value Ref Range   Vitamin B1 (Thiamine) 43 (H) 8 - 30 nmol/L    Assessment & Plan:   Problem List Items Addressed This Visit     Encounter for general adult medical examination with abnormal findings - Primary (Chronic)    Preventative protocols reviewed and updated unless pt declined. Discussed healthy diet and lifestyle.  Discussed CDC recommended HIV screen once per lifetime - will draw.       GERD (gastroesophageal reflux disease)    Continue low dose omeprazole '20mg'$  daily.       Relevant Medications   omeprazole (PRILOSEC) 20 MG capsule   Habitual alcohol use    He has significantly cut down on his drinking      Tobacco abuse    Congratulated on cigarette cessation. Encouraged cigar cessation as well.  He continues lung cancer screening CT but if he undergoes CTA chest, anticipate this year's screening CT may be postponed.       Asbestos exposure   Emphysema of lung ([place])    Congratulated on cigarette cessation. Encouraged cigar cessation as well.       Vitamin B1 deficiency     Levels now high - will drop thiamine '100mg'$  to once weekly.       Chest pain    Notes persistent left sided chest discomfort present worse in evenings despite omeprazole.  He is overdue for CTA chest - he will call to schedule. He is overdue for cards f/u - he will call to schedule      [name]    Update CBC.       Mixed hyperlipidemia    Chronic, now on atorvastatin '40mg'$  daily - continue. The 10-year ASCVD risk score (Arnett DK, et al., 2019) is: 6%   Values used to calculate the score:     Age: 89 years     Sex: Male     Is Non-Hispanic African American: No     Diabetic: No     Tobacco smoker: Yes     Systolic Blood Pressure: A999333 mmHg     Is BP treated: No     HDL Cholesterol: 75 mg/dL     Total Cholesterol: 189 mg/dL       Relevant Medications   atorvastatin (LIPITOR) 40 MG tablet   Bilateral pulmonary embolism (HCC)    Bilat PEs with RLL pulm infarct.  Overdue to CTA chest which has been ordered by cardiology. # provided to call [facility] to schedule appt.  He is completing 6 months of eliquis treatment.  Also due for heme f/u and possible hypercoagulability panel - # provided to contact Dr Chryl Heck hematology      Relevant Medications   atorvastatin (LIPITOR) 40 MG tablet   Ulcer of left groin (HCC)    Persistent. Check RPR.  If normal, consider treatment with abx ointment       Relevant Orders   RPR   Other Visit Diagnoses     Encounter for screening for HIV       Relevant Orders   HIV Antibody (routine testing w rflx)   Special screening for malignant neoplasm of prostate            Meds ordered this encounter  Medications   omeprazole (PRILOSEC) 20 MG capsule    Sig: Take 1 capsule (20 mg total) by mouth daily.    Dispense:  90 capsule    Refill:  4   Thiamine HCl (B-1) 100 MG TABS    Sig: Take 1 tablet (100 mg total) by mouth once a week.   atorvastatin (LIPITOR) 40 MG tablet    Sig: Take 1 tablet (40 mg total) by mouth daily.     Dispense:  90 tablet    Refill:  4   gabapentin (NEURONTIN) 300 MG capsule    Sig: Take 1 capsule (300 mg total) by mouth at bedtime.    Dispense:  90 capsule    Refill:  4    Orders Placed This Encounter  Procedures   HIV Antibody (routine testing w rflx)   RPR    Patient Instructions  Lab draw this morning.  Call [facility] [phone] to reschedule CTA chest to follow up on lung blood clots.  Once you complete this CT scan, you may not need lung cancer screening CT 10/2022 for this year.  Call Dr Oralia Rud heart office for follow up visit [phone] and Dr. Chryl Heck blood doctor at [phone] to see if you need further labwork (hypercoagulability panel).  Good to see you today Return as needed or in 1 year for next physical  Follow up plan: Return in about 1 year (around 09/28/2023), or if symptoms worsen or fail to improve, for annual exam, prior fasting for blood work.  Ria Bush, MD

## 2022-09-28 NOTE — Patient Instructions (Addendum)
Lab draw this morning.  Call [facility] [phone] to reschedule CTA chest to follow up on lung blood clots.  Once you complete this CT scan, you may not need lung cancer screening CT 10/2022 for this year.  Call Dr Oralia Rud heart office for follow up visit [phone] and Dr. Chryl Heck blood doctor at [phone] to see if you need further labwork (hypercoagulability panel).  Good to see you today Return as needed or in 1 year for next physical

## 2022-09-28 NOTE — Assessment & Plan Note (Signed)
He has significantly cut down on his drinking

## 2022-09-28 NOTE — Assessment & Plan Note (Signed)
Continue low dose omeprazole '20mg'$  daily.

## 2022-09-28 NOTE — Assessment & Plan Note (Signed)
Levels now high - will drop thiamine '100mg'$  to once weekly.

## 2022-09-28 NOTE — Assessment & Plan Note (Addendum)
Preventative protocols reviewed and updated unless pt declined. Discussed healthy diet and lifestyle.  Discussed CDC recommended HIV screen once per lifetime - will draw.

## 2022-09-28 NOTE — Assessment & Plan Note (Signed)
Congratulated on cigarette cessation. Encouraged cigar cessation as well.  He continues lung cancer screening CT but if he undergoes CTA chest, anticipate this year's screening CT may be postponed.

## 2022-09-28 NOTE — Assessment & Plan Note (Signed)
Notes persistent left sided chest discomfort present worse in evenings despite omeprazole.  He is overdue for CTA chest - he will call to schedule. He is overdue for cards f/u - he will call to schedule

## 2022-09-28 NOTE — Assessment & Plan Note (Signed)
Persistent. Check RPR. If normal, consider treatment with abx ointment

## 2022-09-28 NOTE — Assessment & Plan Note (Signed)
Update CBC. 

## 2022-09-28 NOTE — Telephone Encounter (Signed)
Called Pt regarding MyChart message. Per Dr. Chryl Heck, Pt scheduled for lab appt and MD appt post lab/CT results. Pt verbalized understanding.

## 2022-09-28 NOTE — Assessment & Plan Note (Addendum)
Bilat PEs with RLL pulm infarct.  Overdue to CTA chest which has been ordered by cardiology. # provided to call [facility] to schedule appt.  He is completing 6 months of eliquis treatment.  Also due for heme f/u and possible hypercoagulability panel - # provided to contact Dr Chryl Heck hematology

## 2022-09-28 NOTE — Assessment & Plan Note (Signed)
Congratulated on cigarette cessation. Encouraged cigar cessation as well.

## 2022-09-28 NOTE — Assessment & Plan Note (Addendum)
Chronic, now on atorvastatin '40mg'$  daily - continue. The 10-year ASCVD risk score (Arnett DK, et al., 2019) is: 6%   Values used to calculate the score:     Age: 57 years     Sex: Male     Is Non-Hispanic African American: No     Diabetic: No     Tobacco smoker: Yes     Systolic Blood Pressure: A999333 mmHg     Is BP treated: No     HDL Cholesterol: 75 mg/dL     Total Cholesterol: 189 mg/dL

## 2022-09-29 LAB — HIV ANTIBODY (ROUTINE TESTING W REFLEX): HIV 1&2 Ab, 4th Generation: NONREACTIVE

## 2022-09-29 LAB — RPR: RPR Ser Ql: NONREACTIVE

## 2022-10-01 ENCOUNTER — Other Ambulatory Visit: Payer: Self-pay | Admitting: *Deleted

## 2022-10-01 ENCOUNTER — Inpatient Hospital Stay: Payer: 59 | Attending: Hematology and Oncology

## 2022-10-01 DIAGNOSIS — Z7901 Long term (current) use of anticoagulants: Secondary | ICD-10-CM | POA: Insufficient documentation

## 2022-10-01 DIAGNOSIS — Z79899 Other long term (current) drug therapy: Secondary | ICD-10-CM | POA: Diagnosis not present

## 2022-10-01 DIAGNOSIS — Z86711 Personal history of pulmonary embolism: Secondary | ICD-10-CM | POA: Insufficient documentation

## 2022-10-01 DIAGNOSIS — I2699 Other pulmonary embolism without acute cor pulmonale: Secondary | ICD-10-CM

## 2022-10-01 DIAGNOSIS — F17210 Nicotine dependence, cigarettes, uncomplicated: Secondary | ICD-10-CM | POA: Diagnosis not present

## 2022-10-01 DIAGNOSIS — D721 Eosinophilia, unspecified: Secondary | ICD-10-CM | POA: Insufficient documentation

## 2022-10-01 LAB — CBC WITH DIFFERENTIAL/PLATELET
Abs Immature Granulocytes: 0.02 10*3/uL (ref 0.00–0.07)
Basophils Absolute: 0.1 10*3/uL (ref 0.0–0.1)
Basophils Relative: 1 %
Eosinophils Absolute: 0.2 10*3/uL (ref 0.0–0.5)
Eosinophils Relative: 3 %
HCT: 40.7 % (ref 39.0–52.0)
Hemoglobin: 14.1 g/dL (ref 13.0–17.0)
Immature Granulocytes: 0 %
Lymphocytes Relative: 39 %
Lymphs Abs: 2.6 10*3/uL (ref 0.7–4.0)
MCH: 34.2 pg — ABNORMAL HIGH (ref 26.0–34.0)
MCHC: 34.6 g/dL (ref 30.0–36.0)
MCV: 98.8 fL (ref 80.0–100.0)
Monocytes Absolute: 0.6 10*3/uL (ref 0.1–1.0)
Monocytes Relative: 10 %
Neutro Abs: 3.2 10*3/uL (ref 1.7–7.7)
Neutrophils Relative %: 47 %
Platelets: 210 10*3/uL (ref 150–400)
RBC: 4.12 MIL/uL — ABNORMAL LOW (ref 4.22–5.81)
RDW: 11.5 % (ref 11.5–15.5)
WBC: 6.7 10*3/uL (ref 4.0–10.5)
nRBC: 0 % (ref 0.0–0.2)

## 2022-10-01 LAB — COMPREHENSIVE METABOLIC PANEL
ALT: 39 U/L (ref 0–44)
AST: 42 U/L — ABNORMAL HIGH (ref 15–41)
Albumin: 4.3 g/dL (ref 3.5–5.0)
Alkaline Phosphatase: 63 U/L (ref 38–126)
Anion gap: 4 — ABNORMAL LOW (ref 5–15)
BUN: 16 mg/dL (ref 6–20)
CO2: 29 mmol/L (ref 22–32)
Calcium: 9.2 mg/dL (ref 8.9–10.3)
Chloride: 104 mmol/L (ref 98–111)
Creatinine, Ser: 0.95 mg/dL (ref 0.61–1.24)
GFR, Estimated: >60 mL/min (ref >60)
Glucose, Bld: 110 mg/dL — ABNORMAL HIGH (ref 70–99)
Potassium: 4.3 mmol/L (ref 3.5–5.1)
Sodium: 137 mmol/L (ref 135–145)
Total Bilirubin: 0.3 mg/dL (ref 0.3–1.2)
Total Protein: 6.8 g/dL (ref 6.5–8.1)

## 2022-10-01 LAB — ANTITHROMBIN III: AntiThromb III Func: 102 % (ref 75–120)

## 2022-10-02 LAB — PROTEIN S ACTIVITY: Protein S Activity: 113 % (ref 63–140)

## 2022-10-02 LAB — LUPUS ANTICOAGULANT PANEL
DRVVT: 51.3 s — ABNORMAL HIGH (ref 0.0–47.0)
PTT Lupus Anticoagulant: 34.1 s (ref 0.0–43.5)

## 2022-10-02 LAB — HOMOCYSTEINE: Homocysteine: 10.8 umol/L (ref 0.0–14.5)

## 2022-10-02 LAB — PROTEIN S, TOTAL: Protein S Ag, Total: 79 % (ref 60–150)

## 2022-10-02 LAB — PROTEIN C ACTIVITY: Protein C Activity: 154 % (ref 73–180)

## 2022-10-02 LAB — DRVVT MIX: dRVVT Mix: 44.6 s — ABNORMAL HIGH (ref 0.0–40.4)

## 2022-10-02 LAB — DRVVT CONFIRM: dRVVT Confirm: 1 ratio (ref 0.8–1.2)

## 2022-10-03 LAB — BETA-2-GLYCOPROTEIN I ABS, IGG/M/A
Beta-2 Glyco I IgG: <9 GPI IgG units (ref 0–20)
Beta-2-Glycoprotein I IgA: <9 GPI IgA units (ref 0–25)
Beta-2-Glycoprotein I IgM: <9 GPI IgM units (ref 0–32)

## 2022-10-03 LAB — CARDIOLIPIN ANTIBODIES, IGG, IGM, IGA
Anticardiolipin IgA: 15 APL U/mL — ABNORMAL HIGH (ref 0–11)
Anticardiolipin IgG: <9 GPL U/mL (ref 0–14)
Anticardiolipin IgM: <9 MPL U/mL (ref 0–12)

## 2022-10-03 LAB — PROTEIN C, TOTAL: Protein C, Total: 138 % (ref 60–150)

## 2022-10-09 LAB — FACTOR 5 LEIDEN

## 2022-10-10 ENCOUNTER — Ambulatory Visit (HOSPITAL_BASED_OUTPATIENT_CLINIC_OR_DEPARTMENT_OTHER)
Admission: RE | Admit: 2022-10-10 | Discharge: 2022-10-10 | Disposition: A | Discharge status: Home / Self Care | Payer: 59 | Source: Ambulatory Visit | Attending: Hematology and Oncology | Admitting: Hematology and Oncology

## 2022-10-10 DIAGNOSIS — I2699 Other pulmonary embolism without acute cor pulmonale: Secondary | ICD-10-CM | POA: Diagnosis not present

## 2022-10-10 LAB — PROTHROMBIN GENE MUTATION

## 2022-10-10 MED ORDER — IOHEXOL 350 MG/ML SOLN
75.0000 mL | Freq: Once | INTRAVENOUS | Status: AC | PRN
  Administered 2022-10-10: 75 mL via INTRAVENOUS

## 2022-10-15 ENCOUNTER — Inpatient Hospital Stay (HOSPITAL_BASED_OUTPATIENT_CLINIC_OR_DEPARTMENT_OTHER): Payer: 59 | Admitting: Hematology and Oncology

## 2022-10-15 VITALS — BP 127/91 | HR 70 | Temp 98.1°F | Resp 16 | Ht 70.0 in | Wt 191.4 lb

## 2022-10-15 DIAGNOSIS — I2699 Other pulmonary embolism without acute cor pulmonale: Secondary | ICD-10-CM

## 2022-10-15 DIAGNOSIS — D721 Eosinophilia, unspecified: Secondary | ICD-10-CM | POA: Diagnosis not present

## 2022-10-15 NOTE — Progress Notes (Signed)
[facility] CONSULT NOTE  Patient Care Team: Ria Bush, MD as PCP - General (Family Medicine) Werner Lean, MD as PCP - Cardiology (Cardiology)  CHIEF COMPLAINTS/PURPOSE OF CONSULTATION:  [name], pulmonary embolism and infarction  ASSESSMENT & PLAN:   This is a pleasant 57 year old male patient with past medical history significant for heavy alcohol ingestion who recently presented to his PCP with worsening fatigue, sleeping almost 14 hours a day and some random chest pains.    He continues on anticoagulation.  He is here to review hypercoagulable workup which is essentially negative except for very mildly elevated anticardiolipin antibodies which are not of significance.  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  Recommendation was to repeat imaging in September 2024.  No other new concerns.  He can cancel CT lung cancer screening since he just had chest CT for follow up of PE. He will be due for another CT chest in Sep 2024, this has been ordered.  He continues to have some fatigue and chest pain and he has a follow-up with cardiology this week.  I recommended that he talk to them about it as well as let them know that he will be soon done with anticoagulation.  He expressed understanding.  He will return to clinic in 6 months or sooner as needed.  I encouraged smoking and alcohol cessation.  He has already cut down tremendously on this.  Thank you for consulting [place] the care of this patient.   Please do not hesitate contact us with any additional questions or concerns.  HISTORY OF PRESENTING ILLNESS:   Kirk Bradley 57 y.o. male is here because of eosinophilia.  Interval history  He initially was referred to [place] with  eosinophilia which has resolved.  Since his last visit however he was diagnosed with PE noted on a cardiac CT evaluation and he was started on Eliquis and he was sent back to discuss anticoagulation recommendations.  He continues to have the random chest pain without a clear aggravating or relieving factor.  He is following up with cardiology as well. He is very compliant with Eliquis and denies any issues with bleeding.  He should be done with Eliquis by the end of this month.  He has taken it for almost 6 months.  No worsening shortness of breath or new lower extremity swelling.  He is cut down on alcohol consumption tremendously.  He used to have about 15 drinks a day and now he drinks about 2 a day.  He is also only smoking cigars rarely, cut down much on smoking.   Rest of the pertinent 10 point ROS reviewed and negative  MEDICAL HISTORY:  Past Medical History:  Diagnosis Date   Alcohol abuse    GERD (gastroesophageal reflux disease)    Osteoarthritis    Smoker     SURGICAL HISTORY: Past Surgical History:  Procedure Laterality Date   NO PAST SURGERIES      SOCIAL HISTORY: Social History   Socioeconomic History   Marital status: Married    Spouse name: Not on file   Number of children: Not on file   Years of education: Not on file   Highest education level: Not on file  Occupational History   Not on file  Tobacco Use  Smoking status: Every Day    Types: Cigars   Smokeless tobacco: Never   Tobacco comments:    1-2 ppd  Substance and Sexual Activity   Alcohol use: Yes    Alcohol/week: 0.0 standard drinks of alcohol    Comment: 12-18 pack beer/day   Drug use: Yes    Comment: MJ - occasional   Sexual activity: Not on file  Other Topics Concern   Not on file  Social History Narrative   Lives with wife and 8 dogs   Occupation: works in Architect   Edu: GED   Activity: very active at work   Diet: good water, fruits/vegetables daily   Social Determinants of Adult nurse Strain: Not on Art therapist Insecurity: Not on file  Transportation Needs: Not on file  Physical Activity: Not on file  Stress: Not on file  Social Connections: Not on file  Intimate Partner Violence: Not on file    FAMILY HISTORY: Family History  Problem Relation Age of Onset   Rheum arthritis Mother    Pulmonary embolism Mother    Alcoholism Father    Alcoholism Maternal Grandfather    Alcoholism Maternal Aunt    Alcoholism Maternal Uncle    Cancer Maternal Uncle        unsure, agent orange exposure in [place]   CAD Neg Hx    Diabetes Neg Hx     ALLERGIES:  has No Known Allergies.  MEDICATIONS:  Current Outpatient Medications  Medication Sig Dispense Refill   acetaminophen (TYLENOL) 500 MG tablet Take 500 mg by mouth in the morning.     apixaban (ELIQUIS) 5 MG TABS tablet Take 1 tablet (5 mg total) by mouth 2 (two) times daily. 180 tablet 3   atorvastatin (LIPITOR) 40 MG tablet Take 1 tablet (40 mg total) by mouth daily. 90 tablet 4   cyclobenzaprine (FLEXERIL) 10 MG tablet Take 0.5-1 tablets (5-10 mg total) by mouth 2 (two) times daily as needed for muscle spasms. 30 tablet 3   gabapentin (NEURONTIN) 300 MG capsule Take 1 capsule (300 mg total) by mouth at bedtime. 90 capsule 4   nicotine (NICODERM CQ) 21 mg/24hr patch Place 1 patch (21 mg total) onto the skin daily. 28 patch 0   omeprazole (PRILOSEC) 20 MG capsule Take 1 capsule (20 mg total) by mouth daily. 90 capsule 4   Thiamine HCl (B-1) 100 MG TABS Take 1 tablet (100 mg total) by mouth once a week.     No current facility-administered medications for this visit.     PHYSICAL EXAMINATION: ECOG PERFORMANCE STATUS: 0 - Asymptomatic  Vitals:   10/15/22 1044  BP: (!) 127/91  Pulse: 70  Resp: 16  Temp: 98.1 F (36.7 C)  SpO2: 97%    Filed Weights   10/15/22 1044  Weight: 191 lb 6.4 oz (86.8 kg)    GENERAL:alert, no distress and comfortable LUNGS: clear to auscultation and percussion  with normal breathing effort HEART: regular rate & rhythm and no murmurs and no lower extremity edema Abdomen: Soft, nontender, nondistended, no hepatosplenomegaly  LABORATORY DATA:  I have reviewed the data as listed Lab Results  Component Value Date   WBC 6.7 10/01/2022   HGB 14.1 10/01/2022   HCT 40.7 10/01/2022   MCV 98.8 10/01/2022   PLT 210 10/01/2022     Chemistry      Component Value Date/Time   NA 137 10/01/2022 1040   NA 139 03/12/2022 1444   K  4.3 10/01/2022 1040   CL 104 10/01/2022 1040   CO2 29 10/01/2022 1040   BUN 16 10/01/2022 1040   BUN 15 03/12/2022 1444   CREATININE 0.95 10/01/2022 1040   CREATININE 1.09 03/15/2022 1539      Component Value Date/Time   CALCIUM 9.2 10/01/2022 1040   ALKPHOS 63 10/01/2022 1040   AST 42 (H) 10/01/2022 1040   AST 27 03/15/2022 1539   ALT 39 10/01/2022 1040   ALT 26 03/15/2022 1539   BILITOT 0.3 10/01/2022 1040   BILITOT 0.5 03/15/2022 1539       RADIOGRAPHIC STUDIES: I have personally reviewed the radiological images as listed and agreed with the findings in the report. CT Chest W Contrast  Result Date: 10/14/2022 CLINICAL DATA:  Follow-up pulmonary lesion seen on prior cardiac CT scan 04/03/2022 EXAM: CT CHEST WITH CONTRAST TECHNIQUE: Multidetector CT imaging of the chest was performed during intravenous contrast administration. RADIATION DOSE REDUCTION: This exam was performed according to the departmental dose-optimization program which includes automated exposure control, adjustment of the mA and/or kV according to patient size and/or use of iterative reconstruction technique. CONTRAST:  36mL OMNIPAQUE IOHEXOL 350 MG/ML SOLN COMPARISON:  Cardiac CT scan 04/03/2022 FINDINGS: Cardiovascular: The heart is normal in size. No pericardial effusion. The aorta is normal in caliber. No dissection. Stable coronary artery calcifications. The pulmonary arteries are grossly normal. Mediastinum/Nodes: Scattered mediastinal and hilar  lymph nodes are stable. No mass or overt adenopathy. The esophagus is unremarkable. Lungs/Pleura: Persistent masslike partially cavitary lesion in the superior segment of the right lower lobe peripherally. This measures 4.6 x 2.0 cm and previously measured 4.8 x 2.2 cm. It is smoothly marginated and well circumscribed and given its stability over the past 6 months is likely either chronic rounded atelectasis or chronic pulmonary infarct. Small peripheral low-attenuation lesion at the right lung base may be a small pleural fluid collection. Previously measured 15 mm and now measures 9 mm. No new or worrisome pulmonary nodules. No acute pulmonary process. Stable mild underlying emphysematous changes. The central tracheobronchial tree is unremarkable. Cavitary Upper Abdomen: No significant upper abdominal findings. No hepatic or adrenal gland lesions. Musculoskeletal: No chest wall mass, supraclavicular or axillary adenopathy. The thyroid gland is unremarkable. The bony thorax is intact. IMPRESSION: 1. Stable masslike partially cavitary lesion in the superior segment of the right lower lobe peripherally. 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 Six months of stability suggests a benign process. Recommend repeat CT scan in September 2024 which would be a 1 year follow-up from the initial CT. 2. Small peripheral low-attenuation lesion at the right lung base may be a small pleural fluid collection. Previously measured 15 mm and now measures 9 mm. 3. No new or worrisome pulmonary nodules. 4. Stable scattered mediastinal and hilar lymph nodes, likely reactive. 5. Stable emphysematous changes and stable coronary artery calcifications. Emphysema (ICD10-J43.9). Electronically Signed   By: Marijo Sanes M.D.   On: 10/14/2022 10:08    All questions were answered. The patient knows to call the clinic with any problems, questions or concerns. I spent 30 minutes in the care of  this patient including H and P, review of records, counseling and coordination of care.     Benay Pike, MD 10/15/2022 10:58 AM

## 2022-10-15 NOTE — Progress Notes (Unsigned)
Office Visit    Patient Name: Kirk Bradley Date of Encounter: 10/15/2022  Primary Care Provider:  Ria Bush, MD Primary Cardiologist:  Werner Lean, MD Primary Electrophysiologist: None  Chief Complaint    Kirk Bradley is a 57 y.o. 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  Past Medical History    Past Medical History:  Diagnosis Date   Alcohol abuse    GERD (gastroesophageal reflux disease)    Osteoarthritis    Smoker    Past Surgical History:  Procedure Laterality Date   NO PAST SURGERIES      Allergies  No Known Allergies  History of Present Illness    Kirk Bradley  is a 57year old male with the above mention past medical history who presents today for follow-up of coronary artery disease and hyperlipidemia.  Kirk Bradley was initially seen by Dr. Gasper Sells on 03/12/2022 for complaint of chest pain by his PCP.  He endorsed chest pain that had no clear trigger and comes and goes without production.  He reports the pain is located on the left and right side of his chest and is not associated with shortness of breath.  He underwent cardiac CT with ivabradine that showed mild nonobstructive CAD and noncalcified plaque through the proximal LAD.  There was an incidental finding of bilateral PE and patient was started on Eliquis echocardiogram was completed and revealed EF of 55 to 60% with no RWMA and grade 1 DD with normal RV systolic function and trivial MVR with no evidence of AVR or AVS.  He underwent CT of the chest that revealed no pericardial effusion with stable coronary calcifications and normal pulmonary arteries.  There was a stable partial cavitary lesion that was thought to be either atelectasis or chronic pulmonary infarct.  Recommendations were made to repeat CT in September with a follow-up in a year later.  Kirk Bradley presents  today alone for 16-month follow-up.  Since last being seen in the office patient reports that he has been experiencing some increased fatigue and chest discomfort.  He reports that the chest discomfort occurs with or without exertion and is located on the left lower chest area.  He reports that his not associated with any other symptoms but does occasionally travel down his left arm.  He notes that the pain resolved spontaneously without any intervention.  He has stopped smoking cigarettes with the exception of a couple of black and milds.  He is currently using nicotine patches to assist in his efforts.  He is compliant with his current medications and denies any adverse reactions with Eliquis.  He is currently not exercising but does work a heavy exertional job in [employer].  During our visit we discussed the importance of primary prevention and increasing physical activity as tolerated.  Patient denies chest pain, palpitations, dyspnea, PND, orthopnea, nausea, vomiting, dizziness, syncope, edema, weight gain, or early satiety.  Home Medications    Current Outpatient Medications  Medication Sig Dispense Refill   acetaminophen (TYLENOL) 500 MG tablet Take 500 mg by mouth in the morning.     apixaban (ELIQUIS) 5 MG TABS tablet Take 1 tablet (5 mg total) by mouth 2 (two) times daily. 180 tablet 3   atorvastatin (LIPITOR) 40 MG tablet Take 1 tablet (40 mg total) by mouth daily. 90 tablet 4   cyclobenzaprine (FLEXERIL) 10 MG tablet Take 0.5-1 tablets (5-10 mg total) by mouth  2 (two) times daily as needed for muscle spasms. 30 tablet 3   gabapentin (NEURONTIN) 300 MG capsule Take 1 capsule (300 mg total) by mouth at bedtime. 90 capsule 4   nicotine (NICODERM CQ) 21 mg/24hr patch Place 1 patch (21 mg total) onto the skin daily. 28 patch 0   omeprazole (PRILOSEC) 20 MG capsule Take 1 capsule (20 mg total) by mouth daily. 90 capsule 4   Thiamine HCl (B-1) 100 MG TABS Take 1 tablet (100 mg total) by mouth  once a week.     No current facility-administered medications for this visit.     Review of Systems  Please see the history of present illness.    (+) Chest pain (+) Increased fatigue  All other systems reviewed and are otherwise negative except as noted above.  Physical Exam    Wt Readings from Last 3 Encounters:  10/15/22 191 lb 6.4 oz (86.8 kg)  09/28/22 187 lb 6 oz (85 kg)  05/16/22 184 lb 8 oz (83.7 kg)   [id number] were no vitals filed for this visit.,There is no height or weight on file to calculate BMI.  Constitutional:      Appearance: Healthy appearance. Not in distress.  Neck:     Vascular: JVD normal.  Pulmonary:     Effort: Pulmonary effort is normal.     Breath sounds: No wheezing. No rales. Diminished in the bases Cardiovascular:     Normal rate. Regular rhythm. Normal S1. Normal S2.      Murmurs: There is no murmur.  Edema:    Peripheral edema absent.  Abdominal:     Palpations: Abdomen is soft non tender. There is no hepatomegaly.  Skin:    General: Skin is warm and dry.  Neurological:     General: No focal deficit present.     Mental Status: Alert and oriented to person, place and time.     Cranial Nerves: Cranial nerves are intact.  EKG/LABS/ Recent Cardiac Studies    ECG personally reviewed by me today -sinus rhythm with a rate of 74 bpm with possible possible LVH and TWI noted in lead III which is consistent with previous EKG with no acute changes noted.    Lab Results  Component Value Date   WBC 6.7 10/01/2022   HGB 14.1 10/01/2022   HCT 40.7 10/01/2022   MCV 98.8 10/01/2022   PLT 210 10/01/2022   Lab Results  Component Value Date   CREATININE 0.95 10/01/2022   BUN 16 10/01/2022   NA 137 10/01/2022   K 4.3 10/01/2022   CL 104 10/01/2022   CO2 29 10/01/2022   Lab Results  Component Value Date   ALT 39 10/01/2022   AST 42 (H) 10/01/2022   ALKPHOS 63 10/01/2022   BILITOT 0.3 10/01/2022   Lab Results  Component Value Date   CHOL  155 09/28/2022   HDL 46.60 09/28/2022   LDLCALC 85 09/28/2022   TRIG 114.0 09/28/2022   CHOLHDL 3 09/28/2022    No results found for: "HGBA1C"  Cardiac Studies & Procedures       ECHOCARDIOGRAM  ECHOCARDIOGRAM COMPLETE 04/26/2022  Narrative ECHOCARDIOGRAM REPORT    Patient Name:   STEEVEN HARSHMAN Date of Exam: 04/26/2022 Medical Rec #:  [id number]         Height:       72.0 in Accession #:    [id number]        Weight:       176.6 lb Date  of Birth:  October 15, 1965         BSA:          2.021 m Patient Age:    15 years          BP:           133/95 mmHg Patient Gender: M                 HR:           74 bpm. Exam Location:  [place]  Procedure: 2D Echo, 3D Echo, Cardiac Doppler and Color Doppler  Indications:    I26.99 Pulmonary Embolism  History:        Patient has no prior history of Echocardiogram examinations. Signs/Symptoms:Chest Pain; Risk Factors:Current Smoker and HLD.  Sonographer:    Marygrace Drought RCS Referring Phys: [id number] Gray A CHANDRASEKHAR  IMPRESSIONS   1. Left ventricular ejection fraction, by estimation, is 55 to 60%. The left ventricle has normal function. The left ventricle has no regional wall motion abnormalities. Left ventricular diastolic parameters are consistent with Grade I diastolic dysfunction (impaired relaxation). 2. Right ventricular systolic function is normal. The right ventricular size is normal. 3. The mitral valve is normal in structure. Trivial mitral valve regurgitation. No evidence of mitral stenosis. 4. The aortic valve is tricuspid. Aortic valve regurgitation is not visualized. No aortic stenosis is present. 5. The inferior vena cava is normal in size with greater than 50% respiratory variability, suggesting right atrial pressure of 3 mmHg.  Comparison(s): No prior Echocardiogram.  FINDINGS Left Ventricle: Left ventricular ejection fraction, by estimation, is 55 to 60%. The left ventricle has normal function. The left  ventricle has no regional wall motion abnormalities. The left ventricular internal cavity size was normal in size. There is no left ventricular hypertrophy. Left ventricular diastolic parameters are consistent with Grade I diastolic dysfunction (impaired relaxation).  Right Ventricle: The right ventricular size is normal. Right ventricular systolic function is normal.  Left Atrium: Left atrial size was normal in size.  Right Atrium: Right atrial size was normal in size.  Pericardium: There is no evidence of pericardial effusion.  Mitral Valve: The mitral valve is normal in structure. Trivial mitral valve regurgitation. No evidence of mitral valve stenosis.  Tricuspid Valve: The tricuspid valve is normal in structure. Tricuspid valve regurgitation is not demonstrated. No evidence of tricuspid stenosis.  Aortic Valve: The aortic valve is tricuspid. Aortic valve regurgitation is not visualized. No aortic stenosis is present.  Pulmonic Valve: The pulmonic valve was normal in structure. Pulmonic valve regurgitation is trivial. No evidence of pulmonic stenosis.  Aorta: The aortic root is normal in size and structure.  Venous: The inferior vena cava is normal in size with greater than 50% respiratory variability, suggesting right atrial pressure of 3 mmHg.  IAS/Shunts: No atrial level shunt detected by color flow Doppler.   LEFT VENTRICLE PLAX 2D LVIDd:         4.30 cm   Diastology LVIDs:         2.90 cm   LV e' medial:    7.62 cm/s LV PW:         1.00 cm   LV E/e' medial:  7.7 LV IVS:        0.90 cm   LV e' lateral:   6.53 cm/s LVOT diam:     2.10 cm   LV E/e' lateral: 9.0 LV SV:         52 LV SV Index:  26 LVOT Area:     3.46 cm  3D Volume EF: 3D EF:        54 % LV EDV:       78 ml LV ESV:       36 ml LV SV:        43 ml  RIGHT VENTRICLE RV Basal diam:  2.90 cm RV S prime:     13.10 cm/s TAPSE (M-mode): 1.8 cm  LEFT ATRIUM             Index        RIGHT ATRIUM            Index LA diam:        3.30 cm 1.63 cm/m   RA Area:     13.10 cm LA Vol (A2C):   43.1 ml 21.30 ml/m  RA Volume:   29.30 ml  14.50 ml/m LA Vol (A4C):   24.7 ml 12.22 ml/m LA Biplane Vol: 34.7 ml 17.17 ml/m AORTIC VALVE LVOT Vmax:   89.40 cm/s LVOT Vmean:  63.400 cm/s LVOT VTI:    0.150 m  AORTA Ao Root diam: 3.40 cm Ao Asc diam:  3.30 cm  MITRAL VALVE MV Area (PHT):             SHUNTS MV Decel Time:             Systemic VTI:  0.15 m MV E velocity: 58.70 cm/s  Systemic Diam: 2.10 cm MV A velocity: 67.10 cm/s MV E/A ratio:  0.87  Kirk Ruths MD Electronically signed by Kirk Ruths MD Signature Date/Time: 04/26/2022/3:07:23 PM    Final     CT SCANS  CT CORONARY MORPH W/CTA COR W/SCORE 04/04/2022  Addendum 04/04/2022  1:56 PM ADDENDUM REPORT: 04/04/2022 13:54  HISTORY: Chest pain, nonspecific  EXAM: Cardiac/Coronary CT  TECHNIQUE: The patient was scanned on a [employer] scanner.  PROTOCOL: A 120 kV prospective scan was triggered in the descending thoracic aorta at 111 HU's. Axial non-contrast 3 mm slices were carried out through the heart. The data set was analyzed on a dedicated work station and scored using the Agatston method. Gantry rotation speed was 250 msecs and collimation was .6 mm. Heart rate was optimized medically and sl NTG was given. The 3D data set was reconstructed in 5% intervals of the 35-75 % of the R-R cycle. Systolic and diastolic phases were analyzed on a dedicated work station using MPR, MIP and VRT modes. The patient received 139mL OMNIPAQUE IOHEXOL 350 MG/ML SOLN of contrast.  FINDINGS: Coronary calcium score: The patient's coronary artery calcium score is 136, which places the patient in the 85th percentile.  Coronary arteries: Normal coronary origins.  Right dominance.  Right Coronary Artery: Normal caliber vessel, gives rise to PDA. Scattered proximal noncalcified plaque with 1-24% stenosis.  Left Main Coronary Artery:  Normal caliber vessel. Ostial noncalcified plaque with trivial stenosis.  Left Anterior Descending Coronary Artery: Normal caliber vessel. Diffuse mixed calcified and noncalcified plaque throughout the proximal LAD with 25-49% stenosis. Gives rise to three diagonal branches.  Left Circumflex Artery: Small caliber vessel. Proximal noncalcified plaque with 1-24% stenosis. Gives rise to one small OM branch.  Aorta: Normal size, 30 mm at the mid ascending aorta (level of the PA bifurcation) measured double oblique. No aortic atherosclerosis. No dissection seen in visualized portions of the aorta.  Aortic Valve: No calcifications. Trileaflet.  Other findings:  Normal pulmonary vein drainage into the left atrium.  Normal left atrial  appendage without a thrombus.  Normal size of the pulmonary artery. Evidence of pulmonary embolism, see read from [facility].  Normal appearance of the pericardium.  IMPRESSION: 1. Mild nonobstructive CAD, CADRADS = 2. Diffuse mixed calcified and noncalcified plaque throughout the proximal LAD with 25-49% stenosis.  2. Coronary calcium score of 136. This was 85th percentile for age and sex matched control.  3. Normal coronary origin with right dominance.  4. Evidence of pulmonary embolism, see read from [facility]. Findings communicated to Dr. Gasper Sells.  INTERPRETATION:  CAD-RADS 2: Mild non-obstructive CAD (25-49%). Consider non-atherosclerotic causes of chest pain. Consider preventive therapy and risk factor modification.   Electronically Signed By: Buford Dresser M.D. On: 04/04/2022 13:54  Narrative EXAM: OVER-READ INTERPRETATION  CT CHEST  The following report is a limited chest CT over-read performed by radiologist Dr. Yetta Glassman of [facility], [place] on 04/03/2022. This over-read does not include interpretation of cardiac or coronary anatomy or pathology. The coronary CTA  interpretation by the cardiologist is attached.  COMPARISON:  CT chest dated November 14, 2021  FINDINGS: Vascular: Normal heart size. No pericardial effusion. Filling defects of the bilateral lobar pulmonary arteries, some of which have a linear configuration. Segmental filling defect of the medial right middle lobe pulmonary artery. Normal caliber thoracic aorta with no significant atherosclerotic disease.  Mediastinum/Nodes: No enlarged mediastinal or axillary lymph nodes. Thyroid gland, trachea, and esophagus demonstrate no significant findings.  Lungs/Pleura: Central airways are patent. Cavitary masslike opacity of the peripheral superior portion of the right lower lobe with central ground-glass measuring 4.8 x 2.2 cm on series limited image 6. Additional linear nodular opacities seen in the inferior portion of the right lower lobe on series 2, image 45.  Upper Abdomen: No acute abnormality.  Musculoskeletal: Old posterior left-sided rib fractures. No chest wall mass or suspicious bone lesions identified.  IMPRESSION: 1. Pulmonary embolus is seen in the bilateral lobar pulmonary arteries, many of the filling defects have a linear configuration which is compatible with subacute/chronic pulmonary embolus, however acute on chronic can not be excluded. 2. Cavitary masslike opacity of the peripheral right lower lobe with central ground-glass, likely due to pulmonary infarct, although infection or malignancy could also have this appearance. Recommend follow-up chest CT in 3 months to ensure resolution. 3. Additional linear nodular opacity is seen in the inferior portion of the right lower lobe, likely due to scarring. Recommend attention on follow-up.  Critical Value/emergent results were called by telephone at the time of interpretation on 04/04/2022 at 1:29 pm to provider Dr. Baird Kay, who verbally acknowledged these results.  Electronically Signed: By: Yetta Glassman  M.D. On: 04/04/2022 13:30          Assessment & Plan    1.  Nonobstructive CAD/chest pain: -s/p cardiac CTA completed 2023 showing LAD with 25-49% stenosis and calcium score of 136 with normal coronary origins.  Coronary origins. -Continue GDMT with Lipitor 40 mg, patient not on ASA due to Eliquis for PE. -Patient is currently experiencing left-sided chest discomfort that occurs with and without exertion.  -We will send him for an exercise Myoview for further evaluation -He will also use nitroglycerin 0.4 mg as needed and was advised on directions and return precautions to the ED if needed. -We will plan to start 81 mg aspirin once Eliquis is completed.  2.  Bilateral pulmonary embolism: -Patient had complained of chest pain and completed cardiac CTA with incidental finding of bilateral PE. -He was started on Eliquis  5 mg twice daily and will discontinue following his most recent prescription.  3.  Hyperlipidemia: -Patient's most recent LDL cholesterol was 85 -He was advised to continue lifestyle modifications continue Lipitor 40 mg daily  4.  Tobacco abuse: -Today patient reports that he has completed the smoking cigarettes and only smokes a few black and mild cigars. -He was congratulated for his efforts and is currently still using nicotine patches  5.  Fatigue: -Patient reports increased fatigue that is not associated with any shortness of breath. -Stop bang was completed and patient had a score of 3 and is not interested in being evaluated for sleep study. -We will plan to repeat 2D echo based on findings of the above mention nuclear stress test.       Disposition: Follow-up with Werner Lean, MD or APP in 3 months Shared Decision Making/Informed Consent The risks [chest pain, shortness of breath, cardiac arrhythmias, dizziness, blood pressure fluctuations, myocardial infarction, stroke/transient ischemic attack, nausea, vomiting, allergic reaction, radiation  exposure, metallic taste sensation and life-threatening complications (estimated to be 1 in 10,000)], benefits (risk stratification, diagnosing coronary artery disease, treatment guidance) and alternatives of a nuclear stress test were discussed in detail with Mr. Difabio and he agrees to proceed.   Medication Adjustments/Labs and Tests Ordered: Current medicines are reviewed at length with the patient today.  Concerns regarding medicines are outlined above.   Signed, Mable Fill, Marissa Nestle, NP 10/15/2022, 2:08 PM [facility]  Note:  This document was prepared using Dragon voice recognition software and may include unintentional dictation errors.

## 2022-10-17 ENCOUNTER — Encounter: Payer: Self-pay | Admitting: Nurse Practitioner

## 2022-10-17 ENCOUNTER — Ambulatory Visit: Payer: 59 | Attending: Nurse Practitioner | Admitting: Nurse Practitioner

## 2022-10-17 VITALS — BP 122/88 | HR 74 | Ht 72.0 in | Wt 190.2 lb

## 2022-10-17 DIAGNOSIS — E782 Mixed hyperlipidemia: Secondary | ICD-10-CM

## 2022-10-17 DIAGNOSIS — I2510 Atherosclerotic heart disease of native coronary artery without angina pectoris: Secondary | ICD-10-CM

## 2022-10-17 DIAGNOSIS — R079 Chest pain, unspecified: Secondary | ICD-10-CM | POA: Diagnosis not present

## 2022-10-17 DIAGNOSIS — Z720 Tobacco use: Secondary | ICD-10-CM

## 2022-10-17 DIAGNOSIS — R5383 Other fatigue: Secondary | ICD-10-CM

## 2022-10-17 DIAGNOSIS — I2699 Other pulmonary embolism without acute cor pulmonale: Secondary | ICD-10-CM | POA: Diagnosis not present

## 2022-10-17 DIAGNOSIS — I2584 Coronary atherosclerosis due to calcified coronary lesion: Secondary | ICD-10-CM

## 2022-10-17 MED ORDER — NITROGLYCERIN 0.4 MG SL SUBL: 0.4000 mg | SUBLINGUAL_TABLET | SUBLINGUAL | 3 refills | Status: DC | PRN

## 2022-10-17 NOTE — Patient Instructions (Signed)
Medication Instructions:  START Nitroglycerin 0.4mg  Take 1 as needed for emergency chest pain. Take first dose for emergency chest pain; WAIT 5 minutes and if still having chest pain CALL 911 and then take 2nd dose. IF still having pain wait an additional 5 minutes before taking final dose. Do not take more than 3 doses in a day. Once your finish your last dose of Eliquis STOP IT  *If you need a refill on your cardiac medications before your next appointment, please call your pharmacy*   Lab Work: None ordered If you have labs (blood work) drawn today and your tests are completely normal, you will receive your results only by: [place] (if you have MyChart) OR A paper copy in the mail If you have any lab test that is abnormal or we need to change your treatment, we will call you to review the results.   Testing/Procedures: Your physician has requested that you have an exercise stress myoview. For further information please visit [email or web address]. Please follow instruction sheet, as given. NO MEDS TO HOLD   Follow-Up: At [facility], you and your health needs are our priority.  As part of our continuing mission to provide you with exceptional heart care, we have created designated Provider Care Teams.  These Care Teams include your primary Cardiologist (physician) and Advanced Practice Providers (APPs -  Physician Assistants and Nurse Practitioners) who all work together to provide you with the care you need, when you need it.  We recommend signing up for the patient portal called "MyChart".  Sign up information is provided on this After Visit Summary.  MyChart is used to connect with patients for Virtual Visits (Telemedicine).  Patients are able to view lab/test results, encounter notes, upcoming appointments, etc.  Non-urgent messages can be sent to your provider as well.   To learn more about what you can do with MyChart, go to [email or web address].    Your next  appointment:   3 month(s)  Provider:   Werner Lean, MD  or Ambrose Pancoast, NP   Other Instructions

## 2022-10-25 ENCOUNTER — Telehealth (HOSPITAL_COMMUNITY): Payer: Self-pay | Admitting: *Deleted

## 2022-10-25 NOTE — Telephone Encounter (Signed)
Left message on voicemail per DPR in reference to upcoming appointment scheduled on 10/29/2022 at 7:45 with detailed instructions given per Myocardial Perfusion Study Information Sheet for the test. LM to arrive 15 minutes early, and that it is imperative to arrive on time for appointment to keep from having the test rescheduled. If you need to cancel or reschedule your appointment, please call the office within 24 hours of your appointment. Failure to do so may result in a cancellation of your appointment, and a $50 no show fee. Phone number given for call back for any questions.

## 2022-10-29 ENCOUNTER — Ambulatory Visit (HOSPITAL_COMMUNITY): Payer: 59 | Attending: Cardiovascular Disease

## 2022-10-29 DIAGNOSIS — I2699 Other pulmonary embolism without acute cor pulmonale: Secondary | ICD-10-CM | POA: Diagnosis not present

## 2022-10-29 DIAGNOSIS — I2510 Atherosclerotic heart disease of native coronary artery without angina pectoris: Secondary | ICD-10-CM | POA: Diagnosis not present

## 2022-10-29 DIAGNOSIS — I2584 Coronary atherosclerosis due to calcified coronary lesion: Secondary | ICD-10-CM | POA: Insufficient documentation

## 2022-10-29 DIAGNOSIS — Z720 Tobacco use: Secondary | ICD-10-CM | POA: Insufficient documentation

## 2022-10-29 DIAGNOSIS — R079 Chest pain, unspecified: Secondary | ICD-10-CM | POA: Insufficient documentation

## 2022-10-29 LAB — MYOCARDIAL PERFUSION IMAGING
Angina Index: 0
Duke Treadmill Score: 7
Estimated workload: 7.1
Exercise duration (min): 6 min
Exercise duration (sec): 35 s
LV dias vol: 102 mL (ref 62–150)
LV sys vol: 42 mL
MPHR: 166 {beats}/min
Nuc Stress EF: 59 %
Peak HR: 148 {beats}/min
Percent HR: 89 %
Rest HR: 62 {beats}/min
Rest Nuclear Isotope Dose: 10.8 mCi
SDS: 1
SRS: 0
SSS: 1
ST Depression (mm): 0 mm
Stress Nuclear Isotope Dose: 32.8 mCi
TID: 0.98

## 2022-10-29 MED ORDER — TECHNETIUM TC 99M TETROFOSMIN IV KIT
10.8000 | PACK | Freq: Once | INTRAVENOUS | Status: AC | PRN
  Administered 2022-10-29: 10.8 via INTRAVENOUS

## 2022-10-29 MED ORDER — TECHNETIUM TC 99M TETROFOSMIN IV KIT
32.8000 | PACK | Freq: Once | INTRAVENOUS | Status: AC | PRN
  Administered 2022-10-29: 32.8 via INTRAVENOUS

## 2022-11-15 ENCOUNTER — Ambulatory Visit: Payer: Commercial Managed Care - HMO

## 2022-12-19 ENCOUNTER — Telehealth: Payer: Self-pay | Admitting: Hematology and Oncology

## 2023-01-17 NOTE — Progress Notes (Deleted)
Office Visit    Patient Name: Kirk Bradley Date of Encounter: 01/17/2023  Primary Care Provider:  Eustaquio Boyden, MD Primary Cardiologist:  Christell Constant, MD Primary Electrophysiologist: None   Past Medical History    Past Medical History:  Diagnosis Date   Alcohol abuse    GERD (gastroesophageal reflux disease)    Osteoarthritis    Smoker    Past Surgical History:  Procedure Laterality Date   NO PAST SURGERIES      Allergies  No Known Allergies   History of Present Illness    Kirk Bradley is a 57 y.o. 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   Mr. Kirvin was initially seen by Dr. Izora Ribas on 03/12/2022 for complaint of chest pain by his PCP.  He underwent cardiac CT with ivabradine that showed mild nonobstructive CAD and noncalcified plaque through the proximal LAD. There was an incidental finding of bilateral PE and patient was started on Eliquis echocardiogram was completed and revealed EF of 55 to 60% with no RWMA and grade 1 DD with normal RV systolic function and trivial MVR with no evidence of AVR or AVS. He underwent CT of the chest that revealed no pericardial effusion with stable coronary calcifications and normal pulmonary arteries.  He was last seen 10/17/2022 and endorses some fatigue and chest discomfort.  He completed a Myoview stress test that showed no evidence of ischemia and was low risk.  He had also stopped smoking cigarettes and only used a few cigars once in a while.   Since last being seen in the office patient reports***.  Patient denies chest pain, palpitations, dyspnea, PND, orthopnea, nausea, vomiting, dizziness, syncope, edema, weight gain, or early satiety.     ***Notes:  Home Medications    Current Outpatient Medications  Medication Sig Dispense Refill   acetaminophen (TYLENOL) 500 MG tablet Take 500 mg by mouth  in the morning.     apixaban (ELIQUIS) 5 MG TABS tablet Take 1 tablet (5 mg total) by mouth 2 (two) times daily. 180 tablet 3   atorvastatin (LIPITOR) 40 MG tablet Take 1 tablet (40 mg total) by mouth daily. 90 tablet 4   cyclobenzaprine (FLEXERIL) 10 MG tablet Take 0.5-1 tablets (5-10 mg total) by mouth 2 (two) times daily as needed for muscle spasms. 30 tablet 3   gabapentin (NEURONTIN) 300 MG capsule Take 1 capsule (300 mg total) by mouth at bedtime. 90 capsule 4   nicotine (NICODERM CQ) 21 mg/24hr patch Place 1 patch (21 mg total) onto the skin daily. 28 patch 0   nitroGLYCERIN (NITROSTAT) 0.4 MG SL tablet Place 1 tablet (0.4 mg total) under the tongue every 5 (five) minutes as needed for chest pain. 25 tablet 3   omeprazole (PRILOSEC) 20 MG capsule Take 1 capsule (20 mg total) by mouth daily. 90 capsule 4   Thiamine HCl (B-1) 100 MG TABS Take 1 tablet (100 mg total) by mouth once a week.     No current facility-administered medications for this visit.     Review of Systems  Please see the history of present illness.    (+)*** (+)***  All other systems reviewed and are otherwise negative except as noted above.  Physical Exam    Wt Readings from Last 3 Encounters:  10/17/22 190 lb 3.2 oz (86.3 kg)  10/15/22 191 lb 6.4 oz (86.8 kg)  09/28/22 187 lb 6 oz (85  kg)   ZO:XWRUE were no vitals filed for this visit.,There is no height or weight on file to calculate BMI.  Constitutional:      Appearance: Healthy appearance. Not in distress.  Neck:     Vascular: JVD normal.  Pulmonary:     Effort: Pulmonary effort is normal.     Breath sounds: No wheezing. No rales. Diminished in the bases Cardiovascular:     Normal rate. Regular rhythm. Normal S1. Normal S2.      Murmurs: There is no murmur.  Edema:    Peripheral edema absent.  Abdominal:     Palpations: Abdomen is soft non tender. There is no hepatomegaly.  Skin:    General: Skin is warm and dry.  Neurological:     General: No  focal deficit present.     Mental Status: Alert and oriented to person, place and time.     Cranial Nerves: Cranial nerves are intact.  EKG/LABS/ Recent Cardiac Studies    ECG personally reviewed by me today - ***  Cardiac Studies & Procedures     STRESS TESTS  MYOCARDIAL PERFUSION IMAGING 10/29/2022  Narrative   The study is normal. The study is low risk.   No ST deviation was noted.   LV perfusion is normal. There is no evidence of ischemia. There is no evidence of infarction.   Left ventricular function is normal. Nuclear stress EF: 59 %. The left ventricular ejection fraction is normal (55-65%). End diastolic cavity size is normal. End systolic cavity size is normal.  Normal resting and stress perfusion. No ischemia or infarction EF 59%   ECHOCARDIOGRAM  ECHOCARDIOGRAM COMPLETE 04/26/2022  Narrative ECHOCARDIOGRAM REPORT    Patient Name:   Kirk Bradley Date of Exam: 04/26/2022 Medical Rec #:  [id number]         Height:       72.0 in Accession #:    [id number]        Weight:       176.6 lb Date of Birth:  1966-01-10         BSA:          2.021 m Patient Age:    56 years          BP:           133/95 mmHg Patient Gender: M                 HR:           74 bpm. Exam Location:  [address]  Procedure: 2D Echo, 3D Echo, Cardiac Doppler and Color Doppler  Indications:    I26.99 Pulmonary Embolism  History:        Patient has no prior history of Echocardiogram examinations. Signs/Symptoms:Chest Pain; Risk Factors:Current Smoker and HLD.  Sonographer:    Clearence Ped RCS Referring Phys: [id number] MAHESH A CHANDRASEKHAR  IMPRESSIONS   1. Left ventricular ejection fraction, by estimation, is 55 to 60%. The left ventricle has normal function. The left ventricle has no regional wall motion abnormalities. Left ventricular diastolic parameters are consistent with Grade I diastolic dysfunction (impaired relaxation). 2. Right ventricular systolic function is normal. The  right ventricular size is normal. 3. The mitral valve is normal in structure. Trivial mitral valve regurgitation. No evidence of mitral stenosis. 4. The aortic valve is tricuspid. Aortic valve regurgitation is not visualized. No aortic stenosis is present. 5. The inferior vena cava is normal in size with greater than 50% respiratory variability, suggesting right  atrial pressure of 3 mmHg.  Comparison(s): No prior Echocardiogram.  FINDINGS Left Ventricle: Left ventricular ejection fraction, by estimation, is 55 to 60%. The left ventricle has normal function. The left ventricle has no regional wall motion abnormalities. The left ventricular internal cavity size was normal in size. There is no left ventricular hypertrophy. Left ventricular diastolic parameters are consistent with Grade I diastolic dysfunction (impaired relaxation).  Right Ventricle: The right ventricular size is normal. Right ventricular systolic function is normal.  Left Atrium: Left atrial size was normal in size.  Right Atrium: Right atrial size was normal in size.  Pericardium: There is no evidence of pericardial effusion.  Mitral Valve: The mitral valve is normal in structure. Trivial mitral valve regurgitation. No evidence of mitral valve stenosis.  Tricuspid Valve: The tricuspid valve is normal in structure. Tricuspid valve regurgitation is not demonstrated. No evidence of tricuspid stenosis.  Aortic Valve: The aortic valve is tricuspid. Aortic valve regurgitation is not visualized. No aortic stenosis is present.  Pulmonic Valve: The pulmonic valve was normal in structure. Pulmonic valve regurgitation is trivial. No evidence of pulmonic stenosis.  Aorta: The aortic root is normal in size and structure.  Venous: The inferior vena cava is normal in size with greater than 50% respiratory variability, suggesting right atrial pressure of 3 mmHg.  IAS/Shunts: No atrial level shunt detected by color flow  Doppler.   LEFT VENTRICLE PLAX 2D LVIDd:         4.30 cm   Diastology LVIDs:         2.90 cm   LV e' medial:    7.62 cm/s LV PW:         1.00 cm   LV E/e' medial:  7.7 LV IVS:        0.90 cm   LV e' lateral:   6.53 cm/s LVOT diam:     2.10 cm   LV E/e' lateral: 9.0 LV SV:         52 LV SV Index:   26 LVOT Area:     3.46 cm  3D Volume EF: 3D EF:        54 % LV EDV:       78 ml LV ESV:       36 ml LV SV:        43 ml  RIGHT VENTRICLE RV Basal diam:  2.90 cm RV S prime:     13.10 cm/s TAPSE (M-mode): 1.8 cm  LEFT ATRIUM             Index        RIGHT ATRIUM           Index LA diam:        3.30 cm 1.63 cm/m   RA Area:     13.10 cm LA Vol (A2C):   43.1 ml 21.30 ml/m  RA Volume:   29.30 ml  14.50 ml/m LA Vol (A4C):   24.7 ml 12.22 ml/m LA Biplane Vol: 34.7 ml 17.17 ml/m AORTIC VALVE LVOT Vmax:   89.40 cm/s LVOT Vmean:  63.400 cm/s LVOT VTI:    0.150 m  AORTA Ao Root diam: 3.40 cm Ao Asc diam:  3.30 cm  MITRAL VALVE MV Area (PHT):             SHUNTS MV Decel Time:             Systemic VTI:  0.15 m MV E velocity: 58.70 cm/s  Systemic Diam: 2.10  cm MV A velocity: 67.10 cm/s MV E/A ratio:  0.87  Olga Millers MD Electronically signed by Olga Millers MD Signature Date/Time: 04/26/2022/3:07:23 PM    Final     CT SCANS  CT CORONARY MORPH W/CTA COR W/SCORE 04/04/2022  Addendum 04/04/2022  1:56 PM ADDENDUM REPORT: 04/04/2022 13:54  HISTORY: Chest pain, nonspecific  EXAM: Cardiac/Coronary CT  TECHNIQUE: The patient was scanned on a [employer] scanner.  PROTOCOL: A 120 kV prospective scan was triggered in the descending thoracic aorta at 111 HU's. Axial non-contrast 3 mm slices were carried out through the heart. The data set was analyzed on a dedicated work station and scored using the Agatston method. Gantry rotation speed was 250 msecs and collimation was .6 mm. Heart rate was optimized medically and sl NTG was given. The 3D data set was  reconstructed in 5% intervals of the 35-75 % of the R-R cycle. Systolic and diastolic phases were analyzed on a dedicated work station using MPR, MIP and VRT modes. The patient received OMNIPAQUE IOHEXOL 350 MG/ML SOLN of contrast.  FINDINGS: Coronary calcium score: The patient's coronary artery calcium score is 136, which places the patient in the 85th percentile.  Coronary arteries: Normal coronary origins.  Right dominance.  Right Coronary Artery: Normal caliber vessel, gives rise to PDA. Scattered proximal noncalcified plaque with 1-24% stenosis.  Left Main Coronary Artery: Normal caliber vessel. Ostial noncalcified plaque with trivial stenosis.  Left Anterior Descending Coronary Artery: Normal caliber vessel. Diffuse mixed calcified and noncalcified plaque throughout the proximal LAD with 25-49% stenosis. Gives rise to three diagonal branches.  Left Circumflex Artery: Small caliber vessel. Proximal noncalcified plaque with 1-24% stenosis. Gives rise to one small OM branch.  Aorta: Normal size, 30 mm at the mid ascending aorta (level of the PA bifurcation) measured double oblique. No aortic atherosclerosis. No dissection seen in visualized portions of the aorta.  Aortic Valve: No calcifications. Trileaflet.  Other findings:  Normal pulmonary vein drainage into the left atrium.  Normal left atrial appendage without a thrombus.  Normal size of the pulmonary artery. Evidence of pulmonary embolism, see read from [facility].  Normal appearance of the pericardium.  IMPRESSION: 1. Mild nonobstructive CAD, CADRADS = 2. Diffuse mixed calcified and noncalcified plaque throughout the proximal LAD with 25-49% stenosis.  2. Coronary calcium score of 136. This was 85th percentile for age and sex matched control.  3. Normal coronary origin with right dominance.  4. Evidence of pulmonary embolism, see read from [facility]. Findings communicated to  Dr. Izora Ribas.  INTERPRETATION:  CAD-RADS 2: Mild non-obstructive CAD (25-49%). Consider non-atherosclerotic causes of chest pain. Consider preventive therapy and risk factor modification.   Electronically Signed By: Jodelle Red M.D. On: 04/04/2022 13:54  Narrative EXAM: OVER-READ INTERPRETATION  CT CHEST  The following report is a limited chest CT over-read performed by radiologist Dr. Allegra Lai of [facility] on 04/03/2022. This over-read does not include interpretation of cardiac or coronary anatomy or pathology. The coronary CTA interpretation by the cardiologist is attached.  COMPARISON:  CT chest dated November 14, 2021  FINDINGS: Vascular: Normal heart size. No pericardial effusion. Filling defects of the bilateral lobar pulmonary arteries, some of which have a linear configuration. Segmental filling defect of the medial right middle lobe pulmonary artery. Normal caliber thoracic aorta with no significant atherosclerotic disease.  Mediastinum/Nodes: No enlarged mediastinal or axillary lymph nodes. Thyroid gland, trachea, and esophagus demonstrate no significant findings.  Lungs/Pleura: Central airways are patent.  Cavitary masslike opacity of the peripheral superior portion of the right lower lobe with central ground-glass measuring 4.8 x 2.2 cm on series limited image 6. Additional linear nodular opacities seen in the inferior portion of the right lower lobe on series 2, image 45.  Upper Abdomen: No acute abnormality.  Musculoskeletal: Old posterior left-sided rib fractures. No chest wall mass or suspicious bone lesions identified.  IMPRESSION: 1. Pulmonary embolus is seen in the bilateral lobar pulmonary arteries, many of the filling defects have a linear configuration which is compatible with subacute/chronic pulmonary embolus, however acute on chronic can not be excluded. 2. Cavitary masslike opacity of the peripheral right  lower lobe with central ground-glass, likely due to pulmonary infarct, although infection or malignancy could also have this appearance. Recommend follow-up chest CT in 3 months to ensure resolution. 3. Additional linear nodular opacity is seen in the inferior portion of the right lower lobe, likely due to scarring. Recommend attention on follow-up.  Critical Value/emergent results were called by telephone at the time of interpretation on 04/04/2022 at 1:29 pm to provider Dr. Girard Cooter, who verbally acknowledged these results.  Electronically Signed: By: Allegra Lai M.D. On: 04/04/2022 13:30          Risk Assessment/Calculations:   {Does this patient have ATRIAL FIBRILLATION?[phone]}  STOP-Bang Score:  3  { Consider Dx Sleep Disordered Breathing or Sleep Apnea  ICD G47.33          :1}      Lab Results  Component Value Date   WBC 6.7 10/01/2022   HGB 14.1 10/01/2022   HCT 40.7 10/01/2022   MCV 98.8 10/01/2022   PLT 210 10/01/2022   Lab Results  Component Value Date   CREATININE 0.95 10/01/2022   BUN 16 10/01/2022   NA 137 10/01/2022   K 4.3 10/01/2022   CL 104 10/01/2022   CO2 29 10/01/2022   Lab Results  Component Value Date   ALT 39 10/01/2022   AST 42 (H) 10/01/2022   ALKPHOS 63 10/01/2022   BILITOT 0.3 10/01/2022   Lab Results  Component Value Date   CHOL 155 09/28/2022   HDL 46.60 09/28/2022   LDLCALC 85 09/28/2022   TRIG 114.0 09/28/2022   CHOLHDL 3 09/28/2022    No results found for: "HGBA1C"   Assessment & Plan    1.  Nonobstructive CAD/chest pain: -s/p cardiac CTA completed 2023 showing LAD with 25-49% stenosis and calcium score of 136 with normal coronary origins.  2.  Mixed hyperlipidemia  3.Bilateral pulmonary embolism: -Patient had complained of chest pain and completed cardiac CTA with incidental finding of bilateral PE.  4.  Tobacco abuse      Disposition: Follow-up with Christell Constant, MD or APP in *** months {Are  you ordering a CV Procedure (e.g. stress test, cath, DCCV, TEE, etc)?   Press F2        :[id number]}   Medication Adjustments/Labs and Tests Ordered: Current medicines are reviewed at length with the patient today.  Concerns regarding medicines are outlined above.   Signed, Napoleon Form, Leodis Rains, NP 01/17/2023, 12:09 PM [facility]

## 2023-01-18 ENCOUNTER — Ambulatory Visit: Payer: 59 | Attending: Nurse Practitioner | Admitting: Nurse Practitioner

## 2023-01-18 DIAGNOSIS — R079 Chest pain, unspecified: Secondary | ICD-10-CM

## 2023-01-18 DIAGNOSIS — E782 Mixed hyperlipidemia: Secondary | ICD-10-CM

## 2023-01-18 DIAGNOSIS — I2510 Atherosclerotic heart disease of native coronary artery without angina pectoris: Secondary | ICD-10-CM

## 2023-01-18 DIAGNOSIS — Z720 Tobacco use: Secondary | ICD-10-CM

## 2023-02-14 ENCOUNTER — Encounter: Payer: Self-pay | Admitting: Family Medicine

## 2023-02-14 DIAGNOSIS — K219 Gastro-esophageal reflux disease without esophagitis: Secondary | ICD-10-CM

## 2023-02-14 DIAGNOSIS — M159 Polyosteoarthritis, unspecified: Secondary | ICD-10-CM

## 2023-02-14 DIAGNOSIS — E782 Mixed hyperlipidemia: Secondary | ICD-10-CM

## 2023-02-15 MED ORDER — OMEPRAZOLE 20 MG PO CPDR: 20.0000 mg | DELAYED_RELEASE_CAPSULE | Freq: Every day | ORAL | 1 refills | Status: AC

## 2023-02-15 MED ORDER — NITROGLYCERIN 0.4 MG SL SUBL: 0.4000 mg | SUBLINGUAL_TABLET | SUBLINGUAL | 3 refills | Status: AC | PRN

## 2023-02-15 MED ORDER — OMEPRAZOLE 20 MG PO CPDR: 20.0000 mg | DELAYED_RELEASE_CAPSULE | Freq: Every day | ORAL | 0 refills | Status: DC

## 2023-02-15 MED ORDER — CYCLOBENZAPRINE HCL 10 MG PO TABS: 5.0000 mg | ORAL_TABLET | Freq: Two times a day (BID) | ORAL | 3 refills | Status: AC | PRN

## 2023-02-15 MED ORDER — ATORVASTATIN CALCIUM 40 MG PO TABS: 40.0000 mg | ORAL_TABLET | Freq: Every day | ORAL | 1 refills | Status: AC

## 2023-02-15 MED ORDER — ATORVASTATIN CALCIUM 40 MG PO TABS: 40.0000 mg | ORAL_TABLET | Freq: Every day | ORAL | 0 refills | Status: DC

## 2023-02-15 NOTE — Telephone Encounter (Signed)
E-scribed atorvastatin and omeprazole, #90 to new pharmacy.  Flexeril last rx:  08/22/21, #30 Nitroglycerin last rx:  10/17/22, #25 (by [facility])

## 2023-04-17 ENCOUNTER — Ambulatory Visit: Payer: 59 | Admitting: Hematology and Oncology

## 2023-09-27 ENCOUNTER — Other Ambulatory Visit: Payer: 59

## 2023-10-04 ENCOUNTER — Encounter: Payer: 59 | Admitting: Family Medicine
